# Patient Record
Sex: Male | Born: 1970 | Hispanic: No | Marital: Married | Smoking: Never smoker
Health system: Southern US, Community
[De-identification: ages and names within clinical notes are randomized; demographics above are authoritative.]

---

## 1997-07-01 HISTORY — PX: NOSE SURGERY: SHX723

## 2011-08-24 ENCOUNTER — Ambulatory Visit (INDEPENDENT_AMBULATORY_CARE_PROVIDER_SITE_OTHER): Payer: BC Managed Care – PPO | Admitting: Physician Assistant

## 2011-08-24 VITALS — BP 128/85 | HR 74 | Temp 98.3°F | Resp 18 | Ht 67.0 in | Wt 175.0 lb

## 2011-08-24 DIAGNOSIS — J302 Other seasonal allergic rhinitis: Secondary | ICD-10-CM

## 2011-08-24 DIAGNOSIS — J309 Allergic rhinitis, unspecified: Secondary | ICD-10-CM

## 2011-08-24 MED ORDER — FLUTICASONE PROPIONATE 50 MCG/ACT NA SUSP: 2.0000 | Freq: Every day | NASAL | Status: DC

## 2011-08-24 MED ORDER — CETIRIZINE HCL 10 MG PO TABS: 10.0000 mg | ORAL_TABLET | Freq: Every day | ORAL | Status: DC

## 2011-08-24 NOTE — Progress Notes (Signed)
  Subjective:    Patient ID: Ryan Mckinney, male    DOB: [birth year], 41 y.o.   MRN: [id number]  HPI Pt presents to clinic with 1-2 wks h/o congestion with clear/white rhinorrhea with sneezing and itchy ears and eyes - Some PND but no cold.  Does not feel sick.  Has h/o seasonal allergies every spring and this feels like that.  He has never used meds in the past but would like something now.  He has only used Advil without help.   Review of Systems  Constitutional: Negative for fever and chills.  HENT: Positive for congestion, rhinorrhea, sneezing and postnasal drip. Negative for ear pain, sore throat and sinus pressure.   Respiratory: Negative for cough.   Neurological: Negative for dizziness, light-headedness and headaches.       Objective:   Physical Exam  Nursing note and vitals reviewed. Constitutional: He is oriented to person, place, and time. He appears well-developed and well-nourished.  HENT:  Head: Normocephalic and atraumatic.  Right Ear: Hearing, tympanic membrane, external ear and ear canal normal.  Left Ear: Hearing, tympanic membrane, external ear and ear canal normal.  Nose: Mucosal edema (pale and boggy) present. No rhinorrhea.  Mouth/Throat: Uvula is midline and oropharynx is clear and moist. No oropharyngeal exudate.  Cardiovascular: Normal rate, regular rhythm and normal heart sounds.   No murmur heard. Pulmonary/Chest: Effort normal and breath sounds normal.  Neurological: He is alert and oriented to person, place, and time.  Skin: Skin is warm and dry.  Psychiatric: He has a normal mood and affect. His behavior is normal. Judgment and thought content normal.          Assessment & Plan:   1. Seasonal allergies  cetirizine (ZYRTEC) 10 MG tablet, fluticasone (FLONASE) 50 MCG/ACT nasal spray   Pt to try and see which ones helps the most.

## 2012-06-03 ENCOUNTER — Other Ambulatory Visit: Payer: Self-pay | Admitting: Physician Assistant

## 2014-08-26 ENCOUNTER — Telehealth: Payer: Self-pay | Admitting: Emergency Medicine

## 2014-08-26 ENCOUNTER — Ambulatory Visit (INDEPENDENT_AMBULATORY_CARE_PROVIDER_SITE_OTHER): Payer: BLUE CROSS/BLUE SHIELD

## 2014-08-26 ENCOUNTER — Ambulatory Visit (INDEPENDENT_AMBULATORY_CARE_PROVIDER_SITE_OTHER): Payer: BLUE CROSS/BLUE SHIELD | Admitting: Emergency Medicine

## 2014-08-26 VITALS — BP 153/97 | HR 107 | Temp 98.3°F | Resp 17 | Ht 67.0 in | Wt 182.4 lb

## 2014-08-26 DIAGNOSIS — M545 Low back pain, unspecified: Secondary | ICD-10-CM

## 2014-08-26 DIAGNOSIS — M549 Dorsalgia, unspecified: Secondary | ICD-10-CM | POA: Insufficient documentation

## 2014-08-26 DIAGNOSIS — M795 Residual foreign body in soft tissue: Secondary | ICD-10-CM

## 2014-08-26 DIAGNOSIS — R1032 Left lower quadrant pain: Secondary | ICD-10-CM | POA: Insufficient documentation

## 2014-08-26 LAB — POCT UA - MICROSCOPIC ONLY
Bacteria, U Microscopic: NEGATIVE
CASTS, UR, LPF, POC: NEGATIVE
CRYSTALS, UR, HPF, POC: NEGATIVE
MUCUS UA: NEGATIVE
RBC, URINE, MICROSCOPIC: NEGATIVE
WBC, UR, HPF, POC: NEGATIVE
Yeast, UA: NEGATIVE

## 2014-08-26 LAB — COMPREHENSIVE METABOLIC PANEL
ALT: 12 U/L (ref 0–53)
AST: 19 U/L (ref 0–37)
Albumin: 4.6 g/dL (ref 3.5–5.2)
Alkaline Phosphatase: 63 U/L (ref 39–117)
BUN: 14 mg/dL (ref 6–23)
CHLORIDE: 105 meq/L (ref 96–112)
CO2: 26 mEq/L (ref 19–32)
CREATININE: 1.05 mg/dL (ref 0.50–1.35)
Calcium: 9.1 mg/dL (ref 8.4–10.5)
GLUCOSE: 83 mg/dL (ref 70–99)
POTASSIUM: 4.2 meq/L (ref 3.5–5.3)
Sodium: 140 mEq/L (ref 135–145)
TOTAL PROTEIN: 7.7 g/dL (ref 6.0–8.3)
Total Bilirubin: 1 mg/dL (ref 0.2–1.2)

## 2014-08-26 LAB — POCT URINALYSIS DIPSTICK
Bilirubin, UA: NEGATIVE
Glucose, UA: NEGATIVE
Ketones, UA: NEGATIVE
Leukocytes, UA: NEGATIVE
NITRITE UA: NEGATIVE
PH UA: 6.5
Protein, UA: NEGATIVE
RBC UA: NEGATIVE
Spec Grav, UA: 1.02
UROBILINOGEN UA: 1

## 2014-08-26 LAB — POCT CBC
GRANULOCYTE PERCENT: 81.9 % — AB (ref 37–80)
HEMATOCRIT: 47.2 % (ref 43.5–53.7)
HEMOGLOBIN: 15.5 g/dL (ref 14.1–18.1)
Lymph, poc: 1.4 (ref 0.6–3.4)
MCH, POC: 27 pg (ref 27–31.2)
MCHC: 32.9 g/dL (ref 31.8–35.4)
MCV: 82.1 fL (ref 80–97)
MID (cbc): 0.3 (ref 0–0.9)
MPV: 6.7 fL (ref 0–99.8)
POC GRANULOCYTE: 7.4 — AB (ref 2–6.9)
POC LYMPH PERCENT: 15 %L (ref 10–50)
POC MID %: 3.1 %M (ref 0–12)
Platelet Count, POC: 256 10*3/uL (ref 142–424)
RBC: 5.75 M/uL (ref 4.69–6.13)
RDW, POC: 13.8 %
WBC: 9 10*3/uL (ref 4.6–10.2)

## 2014-08-26 LAB — HEMOCCULT GUIAC POC 1CARD (OFFICE): FECAL OCCULT BLD: POSITIVE

## 2014-08-26 MED ORDER — CYCLOBENZAPRINE HCL 10 MG PO TABS: ORAL_TABLET | ORAL | Status: DC

## 2014-08-26 MED ORDER — MELOXICAM 15 MG PO TABS: 15.0000 mg | ORAL_TABLET | Freq: Every day | ORAL | Status: DC

## 2014-08-26 NOTE — Telephone Encounter (Signed)
I called the home phone but there was no answer. I called the patient's place of work and left a message for him to either call me on my cell phone or stop by the office this weekend for repeat x-rays to see if we can better delineate where this foreign body is that was seen on his back films. We need to start by removing all his clothes and then take the films.

## 2014-08-26 NOTE — Progress Notes (Addendum)
Subjective:    Patient ID: Ryan Mckinney, male    DOB: Apr 02, 1971, 44 y.o.   MRN: [id number]  This chart was scribed for Ryan Russian, MD by Stephania Fragmin, ED Scribe. This patient was seen in room 10 and the patient's care was started at 1:59 PM.   HPI HPI Comments: Ryan Mckinney is a 44 y.o. male who presents to the [facility] complaining of chronic back pain that began 1 year ago, as well as bilateral leg pain. He was seen for this by Dr. Jimmye Norman, but he has not had any XRs done. 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 He last took medication for back pain 1 year ago. Patient denies any urinary symptoms.   Patient also complains of intermittent, chronic abdominal pain that has been worsening in the past year. He reports 1 daily BM that is normal. He states that he eats well, but he complains of constipation and pain with BMs. He denies a family history of abdominal problems.     Review of Systems  Constitutional: Negative for fatigue and unexpected weight change.  Eyes: Negative for visual disturbance.  Respiratory: Negative for cough, chest tightness and shortness of breath.   Cardiovascular: Negative for chest pain, palpitations and leg swelling.  Gastrointestinal: Positive for constipation and rectal pain.  Genitourinary: Negative for dysuria, urgency, frequency, hematuria, decreased urine volume and difficulty urinating.  Musculoskeletal: Positive for myalgias and back pain.  Neurological: Negative for dizziness, light-headedness and headaches.       Objective:   Physical Exam  Nursing note and vitals reviewed.  CONSTITUTIONAL: Well developed/well nourished HEAD: Normocephalic/atraumatic EYES: EOMI/PERRL ENMT: Mucous membranes moist NECK: supple no meningeal signs SPINE/BACK: Minimal tenderness over the lumbar spine.  CV: S1/S2 noted, no murmurs/rubs/gallops  noted LUNGS: Lungs are clear to auscultation bilaterally, no apparent distress ABDOMEN: soft, mild tenderness in the LLQ, no rebound, no masses felt GU:no cva tenderness NEURO: Pt is awake/alert/appropriate, moves all extremitiesx4.  No facial droop.   EXTREMITIES: His reflexes are 2+. His motor strength is 5/5. His straight leg is normal to 90 degrees in both legs.  SKIN: warm, color normal PSYCH: no abnormalities of mood noted, alert and oriented to situation  Results for orders placed or performed in visit on 08/26/14  POCT CBC  Result Value Ref Range   WBC 9.0 4.6 - 10.2 K/uL   Lymph, poc 1.4 0.6 - 3.4   POC LYMPH PERCENT 15.0 10 - 50 %L   MID (cbc) 0.3 0 - 0.9   POC MID % 3.1 0 - 12 %M   POC Granulocyte 7.4 (A) 2 - 6.9   Granulocyte percent 81.9 (A) 37 - 80 %G   RBC 5.75 4.69 - 6.13 M/uL   Hemoglobin 15.5 14.1 - 18.1 g/dL   HCT, POC 47.2 43.5 - 53.7 %   MCV 82.1 80 - 97 fL   MCH, POC 27.0 27 - 31.2 pg   MCHC 32.9 31.8 - 35.4 g/dL   RDW, POC 13.8 %   Platelet Count, POC 256 142 - 424 K/uL   MPV 6.7 0 - 99.8 fL  POCT urinalysis dipstick  Result Value Ref Range   Color, UA yellow    Clarity, UA clear    Glucose, UA neg    Bilirubin, UA neg    Ketones, UA neg    Spec Grav, UA 1.020  Blood, UA neg    pH, UA 6.5    Protein, UA neg    Urobilinogen, UA 1.0    Nitrite, UA neg    Leukocytes, UA Negative   POCT UA - Microscopic Only  Result Value Ref Range   WBC, Ur, HPF, POC neg    RBC, urine, microscopic neg    Bacteria, U Microscopic neg    Mucus, UA neg    Epithelial cells, urine per micros 0-2    Crystals, Ur, HPF, POC neg    Casts, Ur, LPF, POC neg    Yeast, UA neg    [facility] reading (PRIMARY) by  Dr. Everlene Farrier back films are normal there are some calcifications deep in the pelvis on the left. No definite kidney stones. Hemoccult POSITIVE      Assessment & Plan:  Referral made to GI for his one-year history of left lower quadrant abdominal pain. He does have blood  in his stool. The pain is been going on for one year. Referral made to GI. Will treat his back pain with Mobic and Flexeril. He was given a note for work today.I personally performed the services described in this documentation, which was scribed in my presence. The recorded information has been reviewed and is accurate. The radiologist saw a linear foreign body present on the abdominal film. It is unclear about what this is. This will need further evaluation. The patient's home phone did not have an answering machine. I called the patient's work and spoke with the operator there and she said she would try and contact one of his friends. They were going to go by's house and tell him to come by this weekend. I would suggest repeat films to start with and see if we can get a better idea about where this foreign body is or if it is in his clothing. Once this is done we can decide on further evaluation.

## 2014-08-26 NOTE — Telephone Encounter (Signed)
I spoke with patient's son. They will come at 10 in the morning to further evaluate the FB seen on film.

## 2014-08-26 NOTE — Patient Instructions (Signed)
Abdominal Pain Many things can cause abdominal pain. Usually, abdominal pain is not caused by a disease and will improve without treatment. It can often be observed and treated at home. Your health care provider will do a physical exam and possibly order blood tests and X-rays to help determine the seriousness of your pain. However, in many cases, more time must pass before a clear cause of the pain can be found. Before that point, your health care provider may not know if you need more testing or further treatment. HOME CARE INSTRUCTIONS  Monitor your abdominal pain for any changes. The following actions may help to alleviate any discomfort you are experiencing:  Only take over-the-counter or prescription medicines as directed by your health care provider.  Do not take laxatives unless directed to do so by your health care provider.  Try a clear liquid diet (broth, tea, or water) as directed by your health care provider. Slowly move to a bland diet as tolerated. SEEK MEDICAL CARE IF:  You have unexplained abdominal pain.  You have abdominal pain associated with nausea or diarrhea.  You have pain when you urinate or have a bowel movement.  You experience abdominal pain that wakes you in the night.  You have abdominal pain that is worsened or improved by eating food.  You have abdominal pain that is worsened with eating fatty foods.  You have a fever. SEEK IMMEDIATE MEDICAL CARE IF:   Your pain does not go away within 2 hours.  You keep throwing up (vomiting).  Your pain is felt only in portions of the abdomen, such as the right side or the left lower portion of the abdomen.  You pass bloody or black tarry stools. MAKE SURE YOU:  Understand these instructions.   Will watch your condition.   Will get help right away if you are not doing well or get worse.  Document Released: 03/27/2005 Document Revised: 06/22/2013 Document Reviewed: 02/24/2013 [facility] Patient Information  2015 [place], [place]. This information is not intended to replace advice given to you by your health care provider. Make sure you discuss any questions you have with your health care provider.    Lumbosacral Strain Lumbosacral strain is a strain of any of the parts that make up your lumbosacral vertebrae. Your lumbosacral vertebrae are the bones that make up the lower third of your backbone. Your lumbosacral vertebrae are held together by muscles and tough, fibrous tissue (ligaments).  CAUSES  A sudden blow to your back can cause lumbosacral strain. Also, anything that causes an excessive stretch of the muscles in the low back can cause this strain. This is typically seen when people exert themselves strenuously, fall, lift heavy objects, bend, or crouch repeatedly. RISK FACTORS  Physically demanding work.  Participation in pushing or pulling sports or sports that require a sudden twist of the back (tennis, golf, baseball).  Weight lifting.  Excessive lower back curvature.  Forward-tilted pelvis.  Weak back or abdominal muscles or both.  Tight hamstrings. SIGNS AND SYMPTOMS  Lumbosacral strain may cause pain in the area of your injury or pain that moves (radiates) down your leg.  DIAGNOSIS Your health care provider can often diagnose lumbosacral strain through a physical exam. In some cases, you may need tests such as X-ray exams.  TREATMENT  Treatment for your lower back injury depends on many factors that your clinician will have to evaluate. However, most treatment will include the use of anti-inflammatory medicines. HOME CARE INSTRUCTIONS   Avoid  hard physical activities (tennis, racquetball, waterskiing) if you are not in proper physical condition for it. This may aggravate or create problems.  If you have a back problem, avoid sports requiring sudden body movements. Swimming and walking are generally safer activities.  Maintain good posture.  Maintain a healthy  weight.  For acute conditions, you may put ice on the injured area.  Put ice in a plastic bag.  Place a towel between your skin and the bag.  Leave the ice on for 20 minutes, 2-3 times a day.  When the low back starts healing, stretching and strengthening exercises may be recommended. SEEK MEDICAL CARE IF:  Your back pain is getting worse.  You experience severe back pain not relieved with medicines. SEEK IMMEDIATE MEDICAL CARE IF:   You have numbness, tingling, weakness, or problems with the use of your arms or legs.  There is a change in bowel or bladder control.  You have increasing pain in any area of the body, including your belly (abdomen).  You notice shortness of breath, dizziness, or feel faint.  You feel sick to your stomach (nauseous), are throwing up (vomiting), or become sweaty.  You notice discoloration of your toes or legs, or your feet get very cold. MAKE SURE YOU:   Understand these instructions.  Will watch your condition.  Will get help right away if you are not doing well or get worse. Document Released: 03/27/2005 Document Revised: 06/22/2013 Document Reviewed: 02/03/2013 [facility] Patient Information 2015 [place], [place]. This information is not intended to replace advice given to you by your health care provider. Make sure you discuss any questions you have with your health care provider.

## 2014-08-27 ENCOUNTER — Ambulatory Visit (HOSPITAL_COMMUNITY)
Admission: RE | Admit: 2014-08-27 | Discharge: 2014-08-27 | Disposition: A | Discharge status: Home / Self Care | Payer: BLUE CROSS/BLUE SHIELD | Source: Ambulatory Visit | Attending: Emergency Medicine | Admitting: Emergency Medicine

## 2014-08-27 ENCOUNTER — Encounter (HOSPITAL_COMMUNITY): Payer: Self-pay

## 2014-08-27 ENCOUNTER — Other Ambulatory Visit: Payer: Self-pay | Admitting: Emergency Medicine

## 2014-08-27 ENCOUNTER — Ambulatory Visit (INDEPENDENT_AMBULATORY_CARE_PROVIDER_SITE_OTHER): Payer: BLUE CROSS/BLUE SHIELD

## 2014-08-27 ENCOUNTER — Ambulatory Visit (INDEPENDENT_AMBULATORY_CARE_PROVIDER_SITE_OTHER): Payer: BLUE CROSS/BLUE SHIELD | Admitting: Emergency Medicine

## 2014-08-27 ENCOUNTER — Encounter: Payer: Self-pay | Admitting: Emergency Medicine

## 2014-08-27 VITALS — BP 130/74 | HR 91 | Temp 98.1°F | Resp 12 | Ht 67.0 in | Wt 177.0 lb

## 2014-08-27 DIAGNOSIS — R935 Abnormal findings on diagnostic imaging of other abdominal regions, including retroperitoneum: Secondary | ICD-10-CM

## 2014-08-27 DIAGNOSIS — R937 Abnormal findings on diagnostic imaging of other parts of musculoskeletal system: Secondary | ICD-10-CM

## 2014-08-27 DIAGNOSIS — R938 Abnormal findings on diagnostic imaging of other specified body structures: Secondary | ICD-10-CM | POA: Insufficient documentation

## 2014-08-27 DIAGNOSIS — M795 Residual foreign body in soft tissue: Secondary | ICD-10-CM | POA: Diagnosis not present

## 2014-08-27 NOTE — Progress Notes (Addendum)
   Subjective:    Patient ID: Ryan Mckinney, male    DOB: Sep 14, 1970, 44 y.o.   MRN: [id number]  HPI This chart was scribed for Ryan Queen, MD by Thea Alken, ED Scribe. This patient was seen in room 9 and the patient's care was started at 10:07 AM.  HPI Comments: Ryan Mckinney is a 44 y.o. male who presents to the [facility] for a recheck. Pt was seen here yesterday with low back and LLQ abdominal pain. Xrays that were done showed a needle like metallic foreign body on the right side of pelvis. Pt was advised to be seen today to further discuss management. Pt denies swallowing anything needle like.  Pt does not sow. Pt denies past surgeries. Pt is from [place] and came to the [place] in 2000.   No past medical history on file. No past surgical history on file. Prior to Admission medications   Medication Sig Start Date End Date Taking? Authorizing Provider  cetirizine (ZYRTEC) 10 MG tablet Take 1 tablet (10 mg total) by mouth daily. 08/24/11 08/23/12  Mancel Bale, PA-C  cyclobenzaprine (FLEXERIL) 10 MG tablet Take 1 tablet at bedtime. 08/26/14   [name], MD  fluticasone (FLONASE) 50 MCG/ACT nasal spray PLACE 2 SPRAYS INTO THE NOSE DAILY. Patient not taking: Reported on 08/26/2014 06/03/12   Rise Mu, PA-C  meloxicam (MOBIC) 15 MG tablet Take 1 tablet (15 mg total) by mouth daily. 08/26/14   [name], MD   Review of Systems  Gastrointestinal: Positive for abdominal pain.    Objective:   Physical Exam  CONSTITUTIONAL: Well developed/well nourished HEAD: Normocephalic/atraumatic EYES: EOMI/PERRL ENMT: Mucous membranes moist NECK: supple no meningeal signs SPINE/BACK:entire spine nontender CV: S1/S2 noted, no murmurs/rubs/gallops noted LUNGS: Lungs are clear to auscultation bilaterally, no apparent distress ABDOMEN: soft, mild LLQ tender, no rebound or guarding, bowel sounds noted throughout abdomen GU:no cva tenderness NEURO: Pt is awake/alert/appropriate, moves  all extremitiesx4.  No facial droop.   EXTREMITIES: pulses normal/equal, full ROM SKIN: warm, color normal PSYCH: no abnormalities of mood noted, alert and oriented to situation [facility] reading (PRIMARY) by  Dr. Everlene Farrier warned body is visible on the AP view. I do not see it on the lateral view.   Assessment & Plan:  We'll proceed with CT of the pelvis to evaluate this area. Patient will go to [facility] his CT I do have his phone numbers down to contact him I have asked him not to leave until I can let him know what the CT shows and we have a plan of action.I personally performed the services described in this documentation, which was scribed in my presence. The recorded information has been reviewed and is accurate. CT shows the foreign body is present adjacent to the iliac crest. There is no associated soft tissue swelling or abscess formation around the foreign body. No treatment necessary.I personally performed the services described in this documentation, which was scribed in my presence. The recorded information has been reviewed and is accurate.

## 2014-08-27 NOTE — Progress Notes (Deleted)
   Subjective:    Patient ID: Ryan Mckinney, male    DOB: [birth year], 44 y.o.   MRN: [id number]  HPI    Review of Systems     Objective:   Physical Exam        Assessment & Plan:

## 2014-08-27 NOTE — Addendum Note (Signed)
Addended by: Burnis Kingfisher on: 08/27/2014 11:35 AM   Modules accepted: Orders

## 2014-09-02 ENCOUNTER — Encounter: Payer: Self-pay | Admitting: Physician Assistant

## 2014-09-02 ENCOUNTER — Ambulatory Visit (INDEPENDENT_AMBULATORY_CARE_PROVIDER_SITE_OTHER): Payer: BLUE CROSS/BLUE SHIELD | Admitting: Physician Assistant

## 2014-09-02 VITALS — BP 128/84 | HR 88 | Ht 66.5 in | Wt 180.4 lb

## 2014-09-02 DIAGNOSIS — R1013 Epigastric pain: Secondary | ICD-10-CM

## 2014-09-02 DIAGNOSIS — K5900 Constipation, unspecified: Secondary | ICD-10-CM

## 2014-09-02 DIAGNOSIS — R1032 Left lower quadrant pain: Secondary | ICD-10-CM

## 2014-09-02 DIAGNOSIS — K921 Melena: Secondary | ICD-10-CM

## 2014-09-02 MED ORDER — MOVIPREP 100 G PO SOLR: 1.0000 | ORAL | Status: DC

## 2014-09-02 NOTE — Patient Instructions (Signed)
You have been scheduled for an endoscopy and colonoscopy. Please follow the written instructions given to you at your visit today. Please pick up your prep supplies at the pharmacy within the next 1-3 days. [facility], [address].  If you use inhalers (even only as needed), please bring them with you on the day of your procedure. Your physician has requested that you go to www.startemmi.com and enter the access code given to you at your visit today. This web site gives a general overview about your procedure. However, you should still follow specific instructions given to you by our office regarding your preparation for the procedure.  Take Benefiber in a glass of water daily to help with constipation.

## 2014-09-02 NOTE — Progress Notes (Signed)
Patient ID: Ryan Mckinney, male   DOB: [birth year], 44 y.o.   MRN: [id number]   Subjective:    Patient ID: Ryan Mckinney, male    DOB: 07/10/1970, 44 y.o.   MRN: [id number]  HPI Ryan Mckinney is a 44 year old [place] male, new to GI referred by Dr. Everlene Farrier for evaluation of left lower quadrant pain, constipation and Hemoccult-positive stool. Patient speaks English and is by himself today. He has no known chronic medical problems, states he has been in the [place] for about 15 years. No prior GI evaluation and His English is a bit limited but he states that he has had pain on his left side off and on over the past 2-3 years, this seems to be worse with lifting which he has to do frequently for his work. He also says he has left lower quadrant discomfort with bowel movements. He has been seeing some bright red blood intermittently with bowel movements over the past couple of years. This is worse if he eats hot peppers and generally doesn't see blood if no hot peppers consumed. His appetite is been good and his weight has been stable. He also complains of epigastric pain postprandially and heartburn and no dysphagia. Pt States he has one small BM daily, some straining. Patient had labs done 09/02/2014 WBC of 9 hemoglobin 15.5 hematocrit of 47.2 MCV of 82 stool was Hemoccult positive UA negative and see met entirely normal. He had apparently also been complaining of some lower back pain. He had plain films done that showed a probable foreign body in the right pelvis. Subsequent CT of the pelvis showed a probable 3.1 cm needlelike objects adjacent to the right posterior iliac wing just adjacent to the bone.  Family hx negative for colon cancer.  Review of Systems Pertinent positive and negative review of systems were noted in the above HPI section.  All other review of systems was otherwise negative.  Outpatient Encounter Prescriptions as of 09/02/2014  Medication Sig  . cyclobenzaprine (FLEXERIL) 10 MG tablet Take 1 tablet at  bedtime.  . meloxicam (MOBIC) 15 MG tablet Take 1 tablet (15 mg total) by mouth daily.  Marland Kitchen MOVIPREP 100 G SOLR Take 1 kit (200 g total) by mouth as directed.  . [DISCONTINUED] cetirizine (ZYRTEC) 10 MG tablet Take 1 tablet (10 mg total) by mouth daily. (Patient not taking: Reported on 09/02/2014)   No Known Allergies Patient Active Problem List   Diagnosis Date Noted  . Back pain 08/26/2014  . Abdominal pain, left lower quadrant 08/26/2014  . Foreign body (FB) in soft tissue 08/26/2014   History   Social History  . Marital Status: Single    Spouse Name: N/A  . Number of Children: 2  . Years of Education: N/A   Occupational History  . Not on file.   Social History Main Topics  . Smoking status: Never Smoker   . Smokeless tobacco: Never Used  . Alcohol Use: No  . Drug Use: No  . Sexual Activity: Not on file   Other Topics Concern  . Not on file   Social History Narrative    [name] family history is not on file.      Objective:    Filed Vitals:   09/02/14 1033  BP: 128/84  Pulse: 88    Physical Exam  well-developed [place] male in no acute distress. Blood pressure 128/84 pulse 88 height 5 foot 6 weight 180. HEENT; nontraumatic normocephalic EOMI PERRLA sclera anicteric, Supple ;no JVD, Cardiovascular; regular  rate and rhythm with S1-S2 no murmur or gallop, Pulmonary; clear bilaterally, Abdomen; soft very minimally tender in the left lower quadrant no palpable mass or hepatosplenomegaly no guarding or rebound bowel sounds are present, Rectal; exam not done he was recently documented Hemoccult positive, Ext; no clubbing cyanosis or edema skin warm and dry, Psych; mood and affect appropriate       Assessment & Plan:   #1  44 yo [place]  male with LLQ pain, and intermittent BRB per rectum x 2-3 yeas. Etiology not clear R/O occult lesion, mild colitis or proctitis. #2 heme + stool #3 Constipation #4 Epigastric pain-r/o PUD, Hpylori  Plan; Pt is advised to  increase water intake, Add benefiber daily Schedule for colonoscopy and EGD with Dr. Ardis Hughs. Procedures discussed in detail and he is agreeable to proceed  Further plans pending above  CC: Dr Everlene Farrier  Alfredia Ferguson PA-C 09/02/2014   Cc: [name], MD

## 2014-09-02 NOTE — Progress Notes (Signed)
i agree with the above note, plan 

## 2014-09-15 ENCOUNTER — Encounter: Payer: Self-pay | Admitting: Gastroenterology

## 2014-10-06 ENCOUNTER — Telehealth: Payer: Self-pay | Admitting: Gastroenterology

## 2014-10-06 DIAGNOSIS — K921 Melena: Secondary | ICD-10-CM

## 2014-10-06 DIAGNOSIS — R1013 Epigastric pain: Secondary | ICD-10-CM

## 2014-10-06 DIAGNOSIS — K5900 Constipation, unspecified: Secondary | ICD-10-CM

## 2014-10-06 MED ORDER — MOVIPREP 100 G PO SOLR: ORAL | Status: DC

## 2014-10-06 NOTE — Telephone Encounter (Signed)
Spoke with Jenny Reichmann, at pt's work.  Pt drank his entire prep after his OV.  Resent Moviprep to pt's pharmacy.  He didn't have his instructions.  Spoke with Jenny Reichmann, who translated for pt-  He did eat today- chips and pancakes.  I told him to only drink clear liquids until 11:00 tomorrow and to push fluids.  I reviewed the prep times with Jenny Reichmann and understanding voiced.

## 2014-10-07 ENCOUNTER — Ambulatory Visit (AMBULATORY_SURGERY_CENTER): Payer: BLUE CROSS/BLUE SHIELD | Admitting: Gastroenterology

## 2014-10-07 ENCOUNTER — Encounter: Payer: Self-pay | Admitting: Gastroenterology

## 2014-10-07 VITALS — BP 126/80 | HR 62 | Temp 96.9°F | Resp 17 | Ht 66.5 in | Wt 180.0 lb

## 2014-10-07 DIAGNOSIS — D122 Benign neoplasm of ascending colon: Secondary | ICD-10-CM

## 2014-10-07 DIAGNOSIS — D123 Benign neoplasm of transverse colon: Secondary | ICD-10-CM | POA: Diagnosis not present

## 2014-10-07 DIAGNOSIS — R1032 Left lower quadrant pain: Secondary | ICD-10-CM

## 2014-10-07 DIAGNOSIS — K2990 Gastroduodenitis, unspecified, without bleeding: Secondary | ICD-10-CM | POA: Diagnosis not present

## 2014-10-07 DIAGNOSIS — K2970 Gastritis, unspecified, without bleeding: Secondary | ICD-10-CM

## 2014-10-07 MED ORDER — SODIUM CHLORIDE 0.9 % IV SOLN: 500.0000 mL | INTRAVENOUS | Status: DC

## 2014-10-07 NOTE — Op Note (Signed)
[facility]  [address] [place], [id number]   COLONOSCOPY PROCEDURE REPORT  PATIENT: Ryan Mckinney, Ryan Mckinney  MR#: [id number] BIRTHDATE: 1971/03/28 , 44  yrs. old GENDER: male ENDOSCOPIST: Milus Banister, MD REFERRED HB:ZJIRCV Everlene Farrier, M.D. PROCEDURE DATE:  10/07/2014 PROCEDURE:   Colonoscopy with snare polypectomy First Screening Colonoscopy - Avg.  risk and is 50 yrs.  old or older - No.  Prior Negative Screening - Now for repeat screening. N/A  History of Adenoma - Now for follow-up colonoscopy & has been > or = to 3 yrs.  N/A ASA CLASS:   Class II INDICATIONS:abdominal pain in the lower left quadrant. MEDICATIONS: Monitored anesthesia care and Propofol 200 mg IV  DESCRIPTION OF PROCEDURE:   After the risks benefits and alternatives of the procedure were thoroughly explained, informed consent was obtained.  The digital rectal exam revealed no abnormalities of the rectum.   The LB [id number]  endoscope was introduced through the anus and advanced to the cecum, which was identified by both the appendix and ileocecal valve. No adverse events experienced.   The quality of the prep was excellent.  The instrument was then slowly withdrawn as the colon was fully examined.   COLON FINDINGS: Three sessile polyps ranging between 3-70mm in size were found in the transverse colon and ascending colon. Polypectomies were performed with a cold snare.  The resection was complete, the polyp tissue was completely retrieved and sent to histology.   The examination was otherwise normal.  Retroflexed views revealed no abnormalities. The time to cecum = 1.2 Withdrawal time = 14.6   The scope was withdrawn and the procedure completed. COMPLICATIONS: There were no immediate complications.  ENDOSCOPIC IMPRESSION: 1. Three sessile polyps ranging between 3-48mm in size were found in the transverse colon and ascending colon; polypectomies were performed with a cold snare 2.   The  examination was otherwise normal  RECOMMENDATIONS: If the polyp(s) removed today are proven to be adenomatous (pre-cancerous) polyps, you will need a colonoscopy in 3-5 years. Otherwise you should continue to follow colorectal cancer screening guidelines for "routine risk" patients with a colonoscopy in 10 years.  You will receive a letter within 1-2 weeks with the results of your biopsy as well as final recommendations.  Please call my office if you have not received a letter after 3 weeks.  eSigned:  Milus Banister, MD 10/07/2014 2:13 PM

## 2014-10-07 NOTE — Progress Notes (Signed)
Called to room to assist during endoscopic procedure.  Patient ID and intended procedure confirmed with present staff. Received instructions for my participation in the procedure from the performing physician.  

## 2014-10-07 NOTE — Op Note (Signed)
[facility]  [address] [place], [address]   ENDOSCOPY PROCEDURE REPORT  PATIENT: Ryan, Mckinney  MR#: [id number] BIRTHDATE: [birth year] , 44  yrs. old GENDER: male ENDOSCOPIST: Milus Banister, MD PROCEDURE DATE:  10/07/2014 PROCEDURE:  EGD w/ biopsy ASA CLASS:     Class II INDICATIONS:  LLQ pain, epigastric pain. MEDICATIONS: Monitored anesthesia care and Propofol 70 mg IV TOPICAL ANESTHETIC: none  DESCRIPTION OF PROCEDURE: After the risks benefits and alternatives of the procedure were thoroughly explained, informed consent was obtained.  The LB [id number] endoscope was introduced through the mouth and advanced to the second portion of the duodenum , Without limitations.  The instrument was slowly withdrawn as the mucosa was fully examined.  There was mild, non-specific distal gastritis.  This was biopsies and sent to pathology.  The examination was otherwise normal. Retroflexed views revealed no abnormalities.     The scope was then withdrawn from the patient and the procedure completed.  COMPLICATIONS: There were no immediate complications.  ENDOSCOPIC IMPRESSION: There was mild, non-specific distal gastritis.  This was biopsies and sent to pathology.  The examination was otherwise normal  RECOMMENDATIONS: Await biopsy results   eSigned:  Milus Banister, MD 10/07/2014 2:24 PM    CC: Dr. Everlene Farrier

## 2014-10-07 NOTE — Patient Instructions (Signed)
Impressions/recommendations:  Polyps (handout given)  Repeat colonoscopy pending pathology results.  Mild gastritis.  Await pathology results.  YOU HAD AN ENDOSCOPIC PROCEDURE TODAY AT [facility]:   Refer to the procedure report that was given to you for any specific questions about what was found during the examination.  If the procedure report does not answer your questions, please call your gastroenterologist to clarify.  If you requested that your care partner not be given the details of your procedure findings, then the procedure report has been included in a sealed envelope for you to review at your convenience later.  YOU SHOULD EXPECT: Some feelings of bloating in the abdomen. Passage of more gas than usual.  Walking can help get rid of the air that was put into your GI tract during the procedure and reduce the bloating. If you had a lower endoscopy (such as a colonoscopy or flexible sigmoidoscopy) you may notice spotting of blood in your stool or on the toilet paper. If you underwent a bowel prep for your procedure, you may not have a normal bowel movement for a few days.  Please Note:  You might notice some irritation and congestion in your nose or some drainage.  This is from the oxygen used during your procedure.  There is no need for concern and it should clear up in a day or so.  SYMPTOMS TO REPORT IMMEDIATELY:   Following lower endoscopy (colonoscopy or flexible sigmoidoscopy):  Excessive amounts of blood in the stool  Significant tenderness or worsening of abdominal pains  Swelling of the abdomen that is new, acute  Fever of 100F or higher   Following upper endoscopy (EGD)  Vomiting of blood or coffee ground material  New chest pain or pain under the shoulder blades  Painful or persistently difficult swallowing  New shortness of breath  Fever of 100F or higher  Black, tarry-looking stools  For urgent or emergent issues, a gastroenterologist can be  reached at any hour by calling [phone].   DIET: Your first meal following the procedure should be a small meal and then it is ok to progress to your normal diet. Heavy or fried foods are harder to digest and may make you feel nauseous or bloated.  Likewise, meals heavy in dairy and vegetables can increase bloating.  Drink plenty of fluids but you should avoid alcoholic beverages for 24 hours.  ACTIVITY:  You should plan to take it easy for the rest of today and you should NOT DRIVE or use heavy machinery until tomorrow (because of the sedation medicines used during the test).    FOLLOW UP: Our staff will call the number listed on your records the next business day following your procedure to check on you and address any questions or concerns that you may have regarding the information given to you following your procedure. If we do not reach you, we will leave a message.  However, if you are feeling well and you are not experiencing any problems, there is no need to return our call.  We will assume that you have returned to your regular daily activities without incident.  If any biopsies were taken you will be contacted by phone or by letter within the next 1-3 weeks.  Please call us at [phone] if you have not heard about the biopsies in 3 weeks.    SIGNATURES/CONFIDENTIALITY: You and/or your care partner have signed paperwork which will be entered into your electronic medical record.  These signatures attest to the fact that that the information above on your After Visit Summary has been reviewed and is understood.  Full responsibility of the confidentiality of this discharge information lies with you and/or your care-partner.

## 2014-10-07 NOTE — Progress Notes (Signed)
A/ox3 pleased with MAC, report to Tracy RN 

## 2014-10-10 ENCOUNTER — Telehealth: Payer: Self-pay | Admitting: *Deleted

## 2014-10-10 NOTE — Telephone Encounter (Signed)
No answer, left message to call if questions or concerns. 

## 2014-10-12 ENCOUNTER — Encounter: Payer: Self-pay | Admitting: Gastroenterology

## 2015-04-04 ENCOUNTER — Encounter: Payer: Self-pay | Admitting: Emergency Medicine

## 2015-07-30 IMAGING — CR DG PELVIS 1-2V
4 series · 4 of 4 positions shown · non-contrast
Comparison: Lumbar spine series 08/26/2014

CLINICAL DATA: Abnormal pelvic x-ray. Foreign body noted on prior
study.

EXAM:
PELVIS - 1-2 VIEW

[AP (1 of 4)]
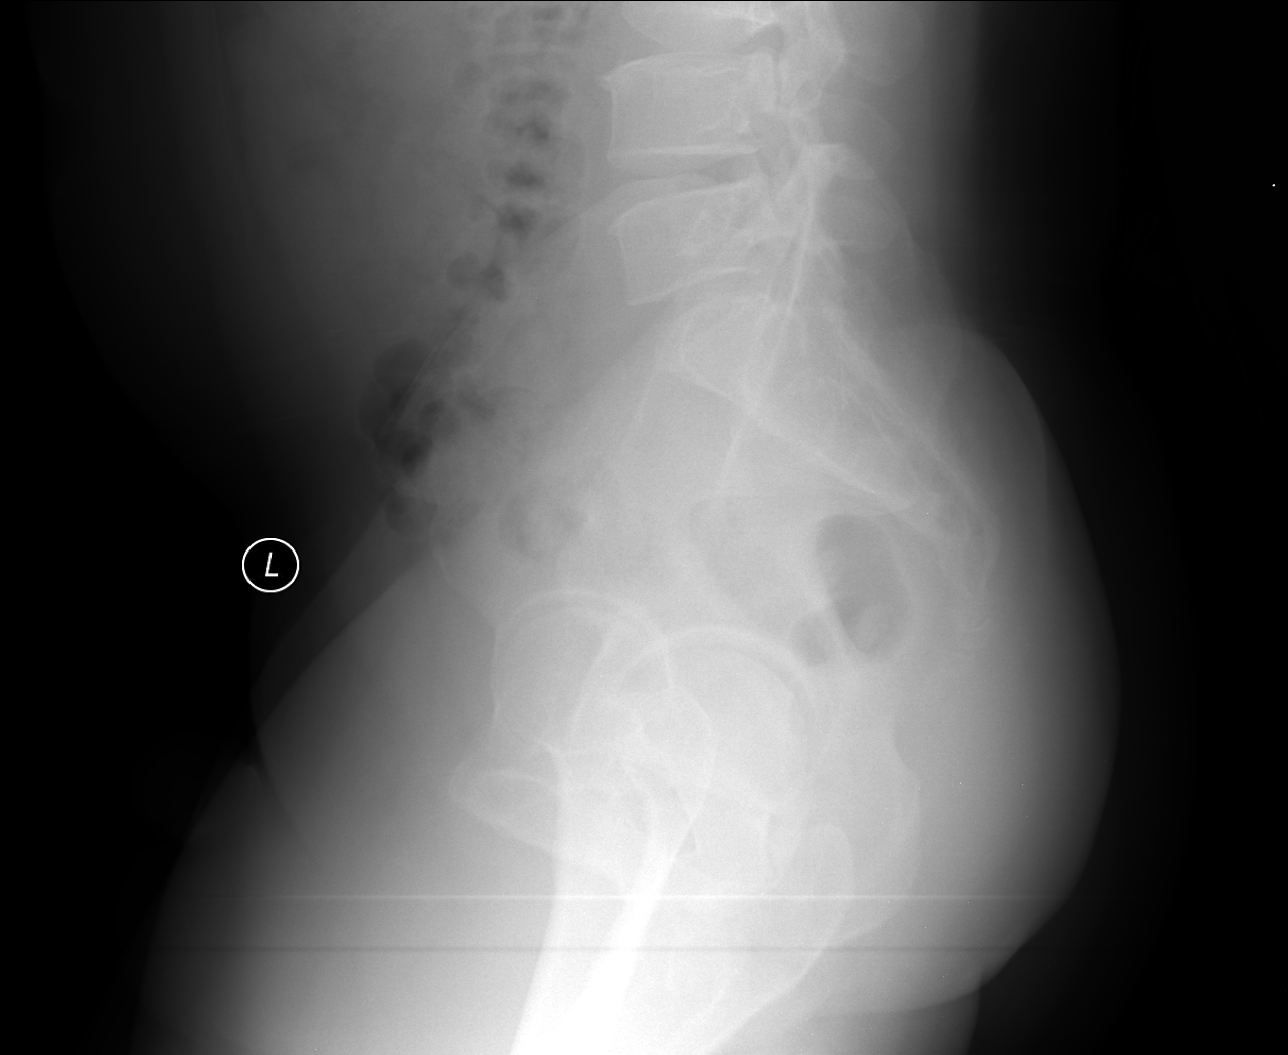

[AP (2 of 4)]
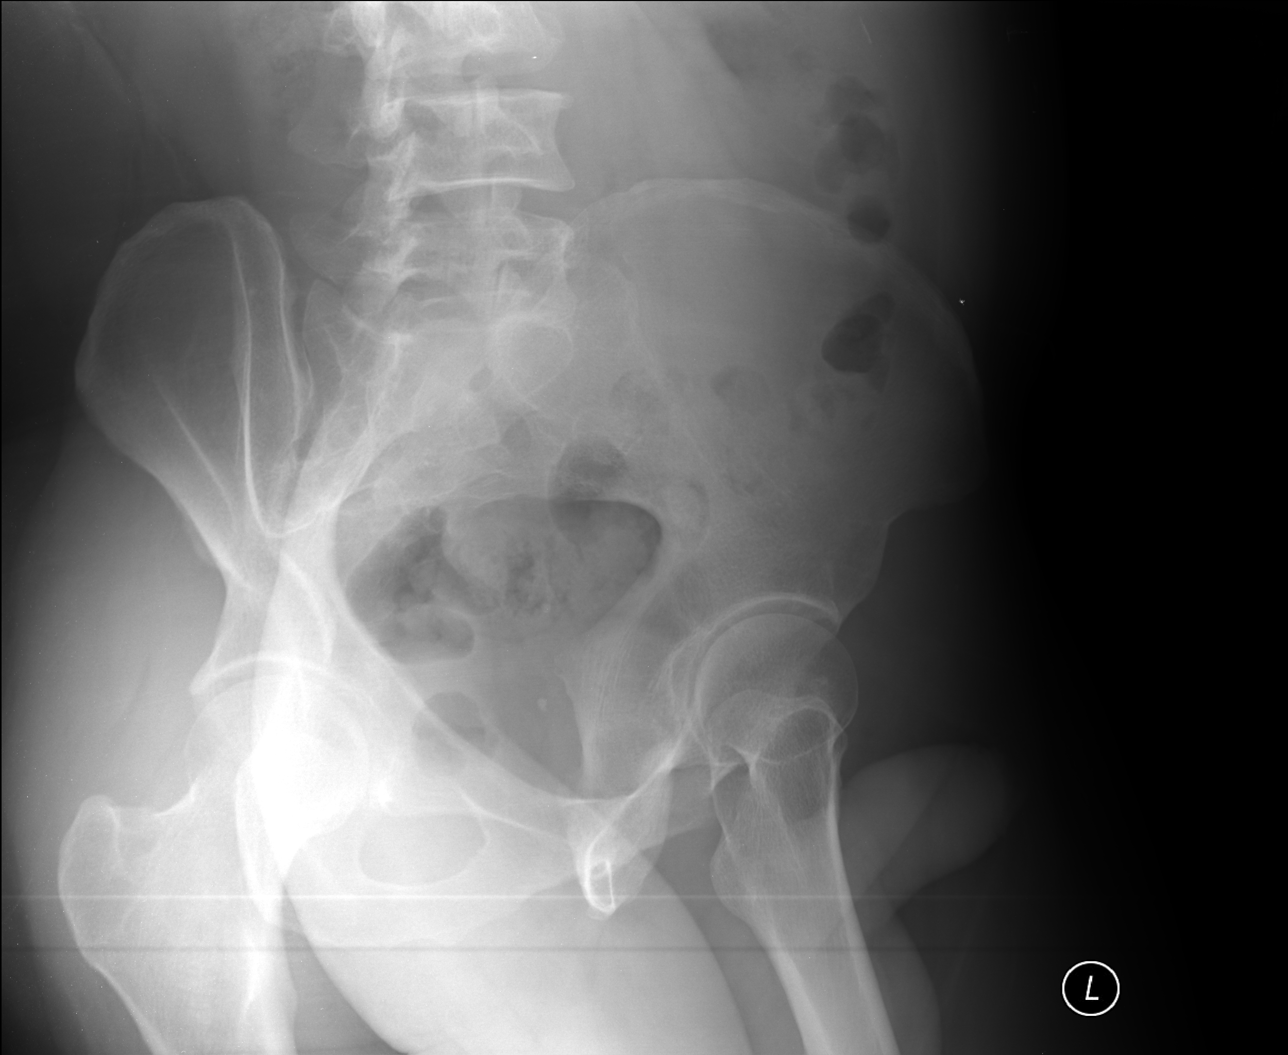

[AP (3 of 4)]
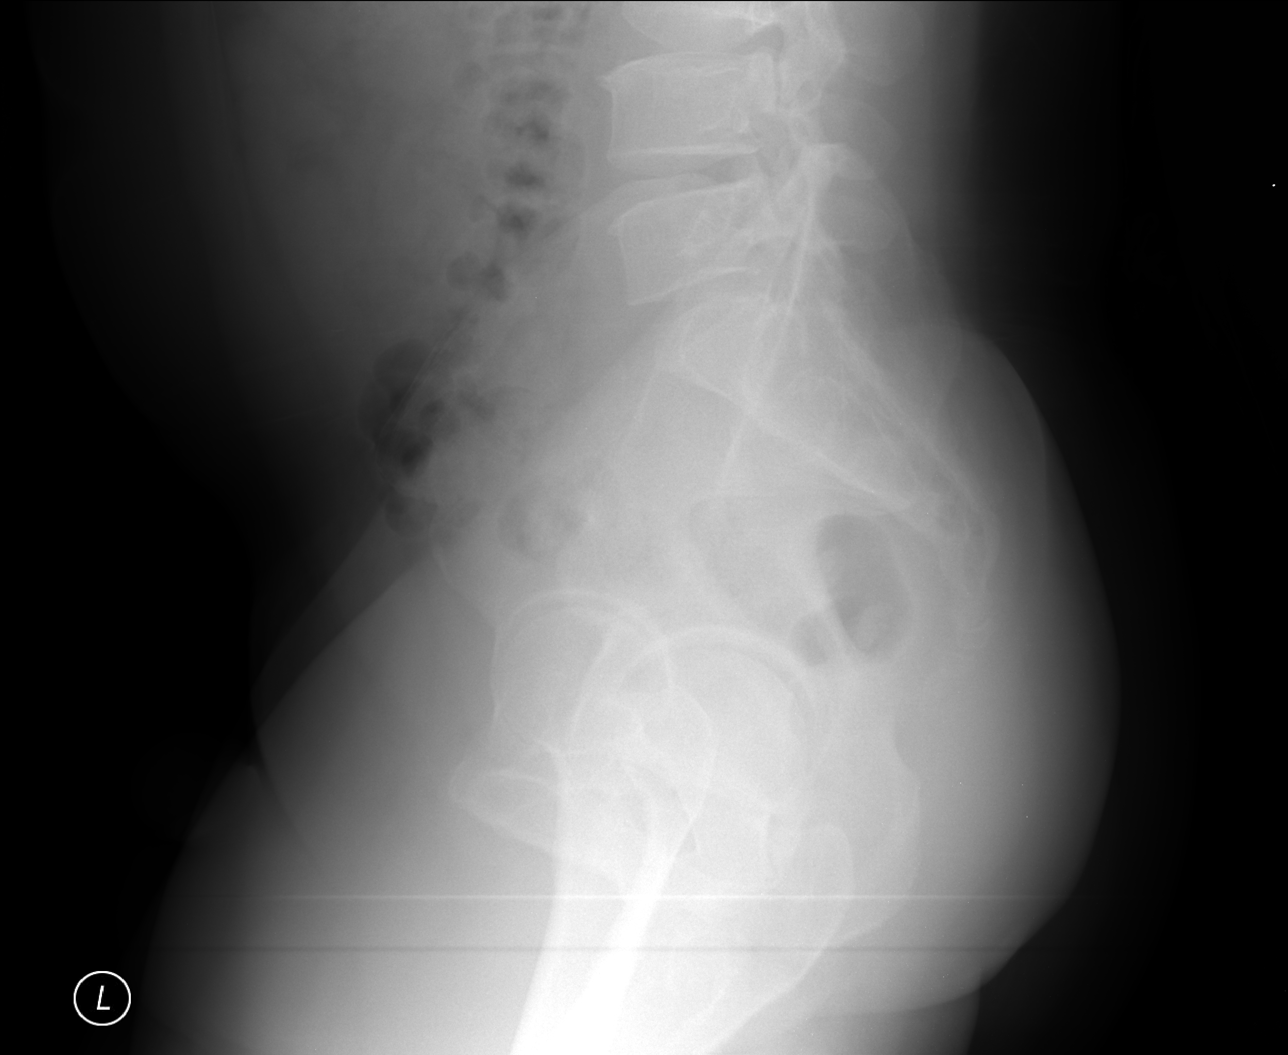

[AP (4 of 4)]
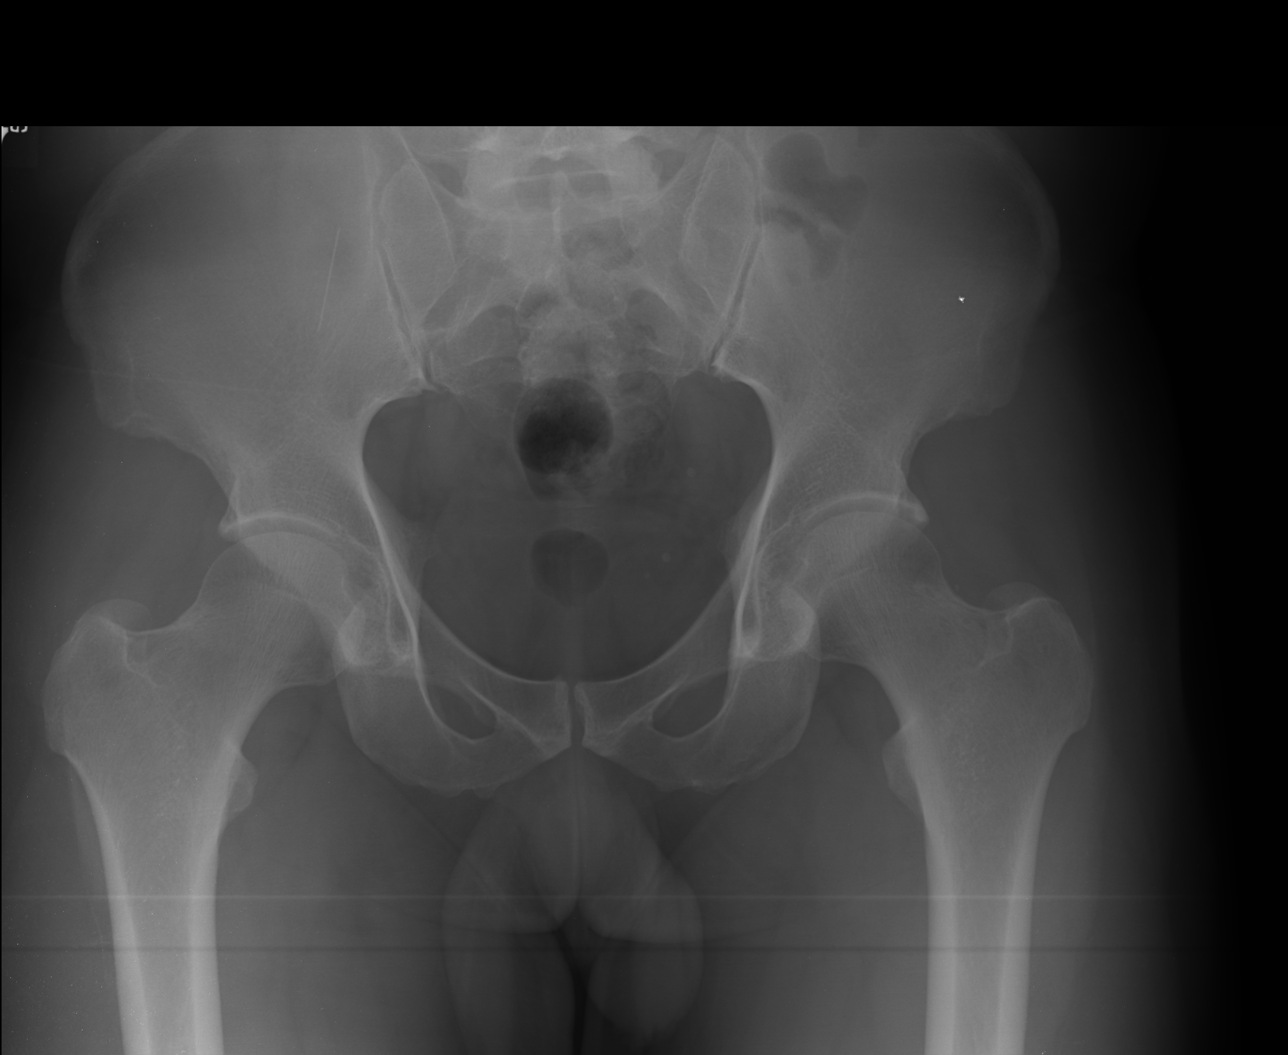

[4 of 4 positions shown; findings below may reference images not displayed]

FINDINGS: Again, on the AP view, linear radiopaque density projects over the
right iliac bone. This is not visualized on the other projections
and is of unknown etiology. If a different cassette was used on
today's study, this is concerning for radiopaque foreign body.
IMPRESSION: Radiopaque foreign body again noted over the right hemipelvis on the
AP view. This is not visualized on additional views. Cassette
artifact remains a possibility if the same cassette was used on the
prior study. If the cassette is different, findings are compatible
with an internal radiopaque foreign body of unknown etiology or
exact location. If further evaluation is felt warranted, CT of the
pelvis without oral or IV contrast may be beneficial.

## 2015-08-25 ENCOUNTER — Ambulatory Visit (INDEPENDENT_AMBULATORY_CARE_PROVIDER_SITE_OTHER): Payer: BLUE CROSS/BLUE SHIELD | Admitting: Emergency Medicine

## 2015-08-25 VITALS — BP 116/74 | HR 78 | Temp 98.4°F | Resp 18 | Wt 187.8 lb

## 2015-08-25 DIAGNOSIS — J309 Allergic rhinitis, unspecified: Secondary | ICD-10-CM | POA: Diagnosis not present

## 2015-08-25 MED ORDER — CETIRIZINE HCL 10 MG PO TABS: 10.0000 mg | ORAL_TABLET | Freq: Every day | ORAL | Status: DC

## 2015-08-25 MED ORDER — FLUTICASONE PROPIONATE 50 MCG/ACT NA SUSP: 2.0000 | Freq: Every day | NASAL | Status: DC

## 2015-08-25 NOTE — Patient Instructions (Signed)
B?nh s?t ma c? kh (Hay Fever) B?nh s?t ma c? kh l ph?n ?ng d? ?ng v?i cc h?t trong khng kh. B?nh khng ly t? ng??i sang ng??i. Khng th? ch?a kh?i h?n b?nh ny nh?ng c th? ki?m sot ???c.  NGUYN NHN B?nh s?t ma c? kh gy b?i ci g ? lm kh?i pht ph?n ?ng d? ?ng (d? nguyn). Sau ?y l nh?ng v d? v? cc d? nguyn:  C? ph?n h??ng.  Lng.  Gu c?a ??ng v?t.  C? v ph?n hoa.  Khi thu?c l.  B?i trong nh.   nhi?m. TRI?U CH?NG  H?t h?i.  Ch?y n??c m?i ho?c ng?t m?i.  Ch?y n??c m?t.  M?t, m?i, mi?ng, h?ng, da ho?c khu v?c khc b? ng?a.  ?au h?ng.  ?au ??u.  Gi?m c?m gic kh?u gic ho?c v? gic. CH?N ?ON Chuyn gia ch?m [place] s?c kh?e s? khm th?c th? v h?i v? cc tri?u ch?ng b?n ?ang c. Xt nghi?m v? d? ?ng c th? ???c th?c hi?n ?? xc ??nh chnh xc nguyn nhn kh?i pht b?nh s?t c? ma kh c?a b?n. ?I?U TR?  Thu?c khng c?n k toa c th? gip gi?m cc tri?u ch?ng. Cc thu?c ny bao g?m:  Thu?c khng histamine.  Thu?c gi?m ng?t m?i. Thu?c ny c th? gip gi?m ngh?t m?i.  N?u nh?ng thu?c khng c?n k toa khng c tc d?ng, chuyn gia ch?m [place] s?c kh?e c th? k ??n cc lo?i thu?c khc.  M?t s? ng??i h??ng l?i t? vi?c chch ng?a d? ?ng khi cc lo?i thu?c khc khng c tc d?ng. H??NG D?N CH?M [place] T?I NH  Trnh d? nguyn gy ra cc tri?u ch?ng c?a b?n, n?u c th?.  S? d?ng t?t c? thu?c theo ch? d?n c?a chuyn gia ch?m [place] s?c kh?e. HY ?I KHM N?U:  B?n c cc tri?u ch?ng d? ?ng nghim tr?ng v cc lo?i thu?c hi?n t?i c?a b?n khng c tc d?ng.  Vi?c ?i?u tr? c lc c hi?u qu?, nh?ng by gi? b?n ?ang c cc tri?u ch?ng.  B?n b? sung huy?t v b? p l?c trong xoang.  B?n b? s?t ho?c ?au ??u.  B?n b? ch?y n??c m?i ??c.  B?n b? hen suy?n v b? ho v th? kh kh n?ng thm. HY NGAY L?P T?C ?I KHM N?U:  B?n b? s?ng l??i ho?c mi.  B?n b? kh th?.  B?n c?m th?y chong vng ho?c c?m th?y nh? s?p ng?t.  B?n ?? m? hi l?nh.  B?n b?  s?t.   Thng tin ny khng nh?m m?c ?ch thay th? cho l?i khuyn m chuyn gia ch?m [place] s?c kh?e ni v?i qu v?. Hy b?o ??m qu v? ph?i th?o lu?n b?t k? v?n ?? g m qu v? c v?i chuyn gia ch?m [place] s?c kh?e c?a qu v?.   Document Released: 06/17/2005 Document Revised: 02/17/2013 Elsevier Interactive Patient Education [employer].

## 2015-08-25 NOTE — Progress Notes (Signed)
By signing my name below, I, Moises Blood, attest that this documentation has been prepared under the direction and in the presence of Arlyss Queen, MD. Electronically Signed: Moises Blood, [place]. 08/25/2015 , 5:00 PM .  Patient was seen in room 14 .  Chief Complaint:  Chief Complaint  Patient presents with  . URI    sore throat, sneezing a lot, runny nose, and eyes watering and itching.     HPI: Ryan Mckinney is a 45 y.o. male who reports to [facility] today complaining of URI symptoms that started 2 weeks ago. He states that it started with rhinorrhea and sneezing. He notes that after he sneezes, his eyes would become itchy and watery. He's had similar symptoms in the past. He denies sore throat.   History reviewed. No pertinent past medical history. Past Surgical History  Procedure Laterality Date  . Nose surgery Right 1999   Social History   Social History  . Marital Status: Single    Spouse Name: N/A  . Number of Children: 2  . Years of Education: N/A   Social History Main Topics  . Smoking status: Never Smoker   . Smokeless tobacco: Never Used  . Alcohol Use: No  . Drug Use: No  . Sexual Activity: Not Asked   Other Topics Concern  . None   Social History Narrative   Family History  Problem Relation Age of Onset  . Colon cancer Neg Hx   . Esophageal cancer Neg Hx   . Rectal cancer Neg Hx   . Stomach cancer Neg Hx    No Known Allergies Prior to Admission medications   Medication Sig Start Date End Date Taking? Authorizing Provider  cyclobenzaprine (FLEXERIL) 10 MG tablet Take 1 tablet at bedtime. Patient not taking: Reported on 08/25/2015 08/26/14   [name], MD  meloxicam (MOBIC) 15 MG tablet Take 1 tablet (15 mg total) by mouth daily. Patient not taking: Reported on 10/07/2014 08/26/14   [name], MD     ROS:  Constitutional: negative for fever, chills, night sweats, weight changes, or fatigue  HEENT: negative for vision changes, hearing loss,  ST, epistaxis, or sinus pressure; positive for rhinorrhea, congestion, sneezing, itchy eyes and eye discharge Cardiovascular: negative for chest pain or palpitations Respiratory: negative for hemoptysis, wheezing, shortness of breath, or cough Abdominal: negative for abdominal pain, nausea, vomiting, diarrhea, or constipation Dermatological: negative for rash Neurologic: negative for headache, dizziness, or syncope All other systems reviewed and are otherwise negative with the exception to those above and in the HPI.  PHYSICAL EXAM: Filed Vitals:   08/25/15 1624  BP: 116/74  Pulse: 78  Temp: 98.4 F (36.9 C)  Resp: 18   Body mass index is 29.86 kg/(m^2).   General: Alert, no acute distress HEENT:  Normocephalic, atraumatic, oropharynx patent; Nasal congestion [facility] Cardiovascular:  Regular rate and rhythm, no rubs murmurs or gallops.  No Carotid bruits, radial pulse intact. No pedal edema.  Respiratory: Clear to auscultation bilaterally.  No wheezes, rales, or rhonchi.  No cyanosis, no use of accessory musculature Abdominal: No organomegaly, abdomen is soft and non-tender, positive bowel sounds. No masses. Musculoskeletal: Gait intact. No edema, tenderness Skin: No rashes. Neurologic: Facial musculature symmetric. Psychiatric: Patient acts appropriately throughout our interaction.  Lymphatic: No cervical or submandibular lymphadenopathy Genitourinary/Anorectal: No acute findings  LABS:   EKG/XRAY:   Primary read interpreted by Dr[facility]lene Farrier at [facility].   ASSESSMENT/PLAN:   patient has signs and  symptoms consistent with seasonal allergic rhinitis. Will treat with Zyrtec and Flonase.I personally performed the services described in this documentation, which was scribed in my presence. The recorded information has been reviewed and is accurate.  Gross sideeffects, risk and benefits, and alternatives of medications d/w patient. Patient is aware that all medications have  potential sideeffects and we are unable to predict every sideeffect or drug-drug interaction that may occur.  Arlyss Queen MD 08/25/2015 5:00 PM

## 2016-11-16 ENCOUNTER — Ambulatory Visit (INDEPENDENT_AMBULATORY_CARE_PROVIDER_SITE_OTHER): Payer: BLUE CROSS/BLUE SHIELD | Admitting: Family Medicine

## 2016-11-16 ENCOUNTER — Encounter: Payer: Self-pay | Admitting: Family Medicine

## 2016-11-16 VITALS — BP 124/80 | HR 103 | Temp 98.5°F | Resp 16 | Ht 66.5 in | Wt 191.6 lb

## 2016-11-16 DIAGNOSIS — H6993 Unspecified Eustachian tube disorder, bilateral: Secondary | ICD-10-CM

## 2016-11-16 DIAGNOSIS — H6983 Other specified disorders of Eustachian tube, bilateral: Secondary | ICD-10-CM | POA: Diagnosis not present

## 2016-11-16 MED ORDER — FLUTICASONE PROPIONATE 50 MCG/ACT NA SUSP: NASAL | 1 refills | Status: DC

## 2016-11-16 MED ORDER — PREDNISONE 20 MG PO TABS: ORAL_TABLET | ORAL | 0 refills | Status: DC

## 2016-11-16 NOTE — Progress Notes (Signed)
Patient ID: Ryan Mckinney, male    DOB: [birth year]  Age: 46 y.o. MRN: [id number]  Chief Complaint  Patient presents with  . Ear Fullness    hard to hear for about two weeks     Subjective:   For the past 2 weeks the patient has been hard of hearing in his ears and feels stuffed up. He thought he might be obstructed.  Current allergies, medications, problem list, past/family and social histories reviewed.  Objective:  BP 124/80 (BP Location: Right Arm, Patient Position: Sitting, Cuff Size: Small)   Pulse (!) 103   Temp 98.5 F (36.9 C) (Oral)   Resp 16   Ht 5' 6.5" (1.689 m)   Wt 191 lb 9.6 oz (86.9 kg)   SpO2 96%   BMI 30.46 kg/m   TMs are entirely normal. Throat clear. Neck supple without significant nodes. No sinus tenderness.  Assessment & Plan:   Assessment: 1. Eustachian tube dysfunction, bilateral       Plan: Consistent with eustachian tube dysfunction. If he is not improved we'll need to send for ENT further evaluation.  No orders of the defined types were placed in this encounter.   Meds ordered this encounter  Medications  . fluticasone (FLONASE) 50 MCG/ACT nasal spray    Sig: Use 2 sprays each nostril twice daily for 5 days, then once daily    Dispense:  16 g    Refill:  1  . predniSONE (DELTASONE) 20 MG tablet    Sig: Take 3 daily for 2 days, then 2 daily for 2 days, then 1 daily for 2 days, then one half daily    Dispense:  12 tablet    Refill:  0         Patient Instructions   Use fluticasone nose spray 2 sprays each nostril twice daily for 5 days, then once daily  Take over-the-counter Claritin-D daily  Take prednisone 3 pills daily for 2 days, then 2 daily for 2 days, then 1 daily for 2 days, then one half daily for 4 days. These should be taken in the morning after breakfast.  If things are getting worse or do not improve over the next week or 2 then we will need to send you to a ear specialist. (ENT)   Eustachian Tube Dysfunction The  eustachian tube connects the middle ear to the back of the nose. It regulates air pressure in the middle ear by allowing air to move between the ear and nose. It also helps to drain fluid from the middle ear space. When the eustachian tube does not function properly, air pressure, fluid, or both can build up in the middle ear. Eustachian tube dysfunction can affect one or both ears. What are the causes? This condition happens when the eustachian tube becomes blocked or cannot open normally. This may result from:  Ear infections.  Colds and other upper respiratory infections.  Allergies.  Irritation, such as from cigarette smoke or acid from the stomach coming up into the esophagus (gastroesophageal reflux).  Sudden changes in air pressure, such as from descending in an airplane.  Abnormal growths in the nose or throat, such as nasal polyps, tumors, or enlarged tissue at the back of the throat (adenoids). What increases the risk? This condition may be more likely to develop in people who smoke and people who are overweight. Eustachian tube dysfunction may also be more likely to develop in children, especially children who have:  Certain birth defects of the  mouth, such as cleft palate.  Large tonsils and adenoids. What are the signs or symptoms? Symptoms of this condition may include:  A feeling of fullness in the ear.  Ear pain.  Clicking or popping noises in the ear.  Ringing in the ear.  Hearing loss.  Loss of balance. Symptoms may get worse when the air pressure around you changes, such as when you travel to an area of high elevation or fly on an airplane. How is this diagnosed? This condition may be diagnosed based on:  Your symptoms.  A physical exam of your ear, nose, and throat.  Tests, such as those that measure:  The movement of your eardrum (tympanogram).  Your hearing (audiometry). How is this treated? Treatment depends on the cause and severity of your  condition. If your symptoms are mild, you may be able to relieve your symptoms by moving air into ("popping") your ears. If you have symptoms of fluid in your ears, treatment may include:  Decongestants.  Antihistamines.  Nasal sprays or ear drops that contain medicines that reduce swelling (steroids). In some cases, you may need to have a procedure to drain the fluid in your eardrum (myringotomy). In this procedure, a small tube is placed in the eardrum to:  Drain the fluid.  Restore the air in the middle ear space. Follow these instructions at home:  Take over-the-counter and prescription medicines only as told by your health care provider.  Use techniques to help pop your ears as recommended by your health care provider. These may include:  Chewing gum.  Yawning.  Frequent, forceful swallowing.  Closing your mouth, holding your nose closed, and gently blowing as if you are trying to blow air out of your nose.  Do not do any of the following until your health care provider approves:  Travel to high altitudes.  Fly in airplanes.  Work in a Pension scheme manager or room.  Scuba dive.  Keep your ears dry. Dry your ears completely after showering or bathing.  Do not smoke.  Keep all follow-up visits as told by your health care provider. This is important. Contact a health care provider if:  Your symptoms do not go away after treatment.  Your symptoms come back after treatment.  You are unable to pop your ears.  You have:  A fever.  Pain in your ear.  Pain in your head or neck.  Fluid draining from your ear.  Your hearing suddenly changes.  You become very dizzy.  You lose your balance. This information is not intended to replace advice given to you by your health care provider. Make sure you discuss any questions you have with your health care provider. Document Released: 07/14/2015 Document Revised: 11/23/2015 Document Reviewed: 07/06/2014 Elsevier  Interactive Patient Education  2017 [employer].    IF you received an x-ray today, you will receive an invoice from [facility]. Please contact [facility] at [phone] with questions or concerns regarding your invoice.   IF you received labwork today, you will receive an invoice from [facility]. Please contact [facility] at [phone] with questions or concerns regarding your invoice.   Our billing staff will not be able to assist you with questions regarding bills from these companies.  You will be contacted with the lab results as soon as they are available. The fastest way to get your results is to activate your My Chart account. Instructions are located on the last page of this paperwork. If you have not heard from  [place] regarding the results in 2 weeks, please contact this office.         Return if symptoms worsen or fail to improve.   Naiah Donahoe, MD 11/16/2016

## 2016-11-16 NOTE — Patient Instructions (Addendum)
Use fluticasone nose spray 2 sprays each nostril twice daily for 5 days, then once daily  Take over-the-counter Claritin-D daily  Take prednisone 3 pills daily for 2 days, then 2 daily for 2 days, then 1 daily for 2 days, then one half daily for 4 days. These should be taken in the morning after breakfast.  If things are getting worse or do not improve over the next week or 2 then we will need to send you to a ear specialist. (ENT)   Eustachian Tube Dysfunction The eustachian tube connects the middle ear to the back of the nose. It regulates air pressure in the middle ear by allowing air to move between the ear and nose. It also helps to drain fluid from the middle ear space. When the eustachian tube does not function properly, air pressure, fluid, or both can build up in the middle ear. Eustachian tube dysfunction can affect one or both ears. What are the causes? This condition happens when the eustachian tube becomes blocked or cannot open normally. This may result from:  Ear infections.  Colds and other upper respiratory infections.  Allergies.  Irritation, such as from cigarette smoke or acid from the stomach coming up into the esophagus (gastroesophageal reflux).  Sudden changes in air pressure, such as from descending in an airplane.  Abnormal growths in the nose or throat, such as nasal polyps, tumors, or enlarged tissue at the back of the throat (adenoids). What increases the risk? This condition may be more likely to develop in people who smoke and people who are overweight. Eustachian tube dysfunction may also be more likely to develop in children, especially children who have:  Certain birth defects of the mouth, such as cleft palate.  Large tonsils and adenoids. What are the signs or symptoms? Symptoms of this condition may include:  A feeling of fullness in the ear.  Ear pain.  Clicking or popping noises in the ear.  Ringing in the ear.  Hearing loss.  Loss  of balance. Symptoms may get worse when the air pressure around you changes, such as when you travel to an area of high elevation or fly on an airplane. How is this diagnosed? This condition may be diagnosed based on:  Your symptoms.  A physical exam of your ear, nose, and throat.  Tests, such as those that measure:  The movement of your eardrum (tympanogram).  Your hearing (audiometry). How is this treated? Treatment depends on the cause and severity of your condition. If your symptoms are mild, you may be able to relieve your symptoms by moving air into ("popping") your ears. If you have symptoms of fluid in your ears, treatment may include:  Decongestants.  Antihistamines.  Nasal sprays or ear drops that contain medicines that reduce swelling (steroids). In some cases, you may need to have a procedure to drain the fluid in your eardrum (myringotomy). In this procedure, a small tube is placed in the eardrum to:  Drain the fluid.  Restore the air in the middle ear space. Follow these instructions at home:  Take over-the-counter and prescription medicines only as told by your health care provider.  Use techniques to help pop your ears as recommended by your health care provider. These may include:  Chewing gum.  Yawning.  Frequent, forceful swallowing.  Closing your mouth, holding your nose closed, and gently blowing as if you are trying to blow air out of your nose.  Do not do any of the following until  your health care provider approves:  Travel to high altitudes.  Fly in airplanes.  Work in a Pension scheme manager or room.  Scuba dive.  Keep your ears dry. Dry your ears completely after showering or bathing.  Do not smoke.  Keep all follow-up visits as told by your health care provider. This is important. Contact a health care provider if:  Your symptoms do not go away after treatment.  Your symptoms come back after treatment.  You are unable to pop your  ears.  You have:  A fever.  Pain in your ear.  Pain in your head or neck.  Fluid draining from your ear.  Your hearing suddenly changes.  You become very dizzy.  You lose your balance. This information is not intended to replace advice given to you by your health care provider. Make sure you discuss any questions you have with your health care provider. Document Released: 07/14/2015 Document Revised: 11/23/2015 Document Reviewed: 07/06/2014 Elsevier Interactive Patient Education  2017 [employer].    IF you received an x-ray today, you will receive an invoice from [facility]. Please contact [facility] at [phone] with questions or concerns regarding your invoice.   IF you received labwork today, you will receive an invoice from [facility]. Please contact [facility] at [phone] with questions or concerns regarding your invoice.   Our billing staff will not be able to assist you with questions regarding bills from these companies.  You will be contacted with the lab results as soon as they are available. The fastest way to get your results is to activate your My Chart account. Instructions are located on the last page of this paperwork. If you have not heard from [place] regarding the results in 2 weeks, please contact this office.

## 2017-04-16 ENCOUNTER — Encounter (INDEPENDENT_AMBULATORY_CARE_PROVIDER_SITE_OTHER): Payer: Self-pay

## 2017-04-16 ENCOUNTER — Ambulatory Visit (INDEPENDENT_AMBULATORY_CARE_PROVIDER_SITE_OTHER): Payer: BLUE CROSS/BLUE SHIELD | Admitting: Gastroenterology

## 2017-04-16 ENCOUNTER — Encounter: Payer: Self-pay | Admitting: Gastroenterology

## 2017-04-16 VITALS — BP 132/72 | HR 68 | Ht 67.0 in | Wt 185.5 lb

## 2017-04-16 DIAGNOSIS — K600 Acute anal fissure: Secondary | ICD-10-CM

## 2017-04-16 DIAGNOSIS — K5900 Constipation, unspecified: Secondary | ICD-10-CM | POA: Diagnosis not present

## 2017-04-16 MED ORDER — NONFORMULARY OR COMPOUNDED ITEM: 4 refills | Status: AC

## 2017-04-16 NOTE — Progress Notes (Signed)
I agree with the above note, plan 

## 2017-04-16 NOTE — Progress Notes (Signed)
04/16/2017 Ryan Mckinney [id number] [birth year]   HISTORY OF PRESENT ILLNESS:  This is a pleasant 46 year old [place] male who is known to Dr. Ardis Hughs.  He had a colonoscopy in April 2016 at which time he was found to have 3 polyps that were removed. They were between 3 and 5 mm.  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 He also had an EGD at that time that showed nonspecific gastritis. Biopsies were negative. H. Pylori.  He presents for office today with complaints of left sided abdominal discomfort, rectal bleeding, rectal pain, and constipation.  He tells me that he feels a lump at his anus.  Hurts to have BM's often.  Sees small amounts of bright red blood on the toilet paper with BM's at times.  Moves his bowels once or twice a day but often has to strain and passes hard stools.  Does not use anything for constipation.     No past medical history on file. Past Surgical History:  Procedure Laterality Date  . NOSE SURGERY Right 1999    reports that he has never smoked. He has never used smokeless tobacco. He reports that he does not drink alcohol or use drugs. family history is not on file. Not on File    Outpatient Encounter Prescriptions as of 04/16/2017  Medication Sig  . [DISCONTINUED] cetirizine (ZYRTEC) 10 MG tablet Take 1 tablet (10 mg total) by mouth daily.  . [DISCONTINUED] cyclobenzaprine (FLEXERIL) 10 MG tablet Take 1 tablet at bedtime. (Patient not taking: Reported on 08/25/2015)  . [DISCONTINUED] fluticasone (FLONASE) 50 MCG/ACT nasal spray Use 2 sprays each nostril twice daily for 5 days, then once daily  . [DISCONTINUED] meloxicam (MOBIC) 15 MG tablet Take 1 tablet (15 mg total) by mouth daily. (Patient not taking: Reported on 10/07/2014)  . [DISCONTINUED] predniSONE (DELTASONE) 20 MG tablet Take 3 daily for 2 days, then 2 daily for 2 days, then 1 daily for 2 days, then one half daily   No  facility-administered encounter medications on file as of 04/16/2017.      REVIEW OF SYSTEMS  : All other systems reviewed and negative except where noted in the History of Present Illness.   PHYSICAL EXAM: BP 132/72   Pulse 68   Ht 5\' 7"  (1.702 m)   Wt 185 lb 8 oz (84.1 kg)   BMI 29.05 kg/m  General: Well developed [place] male in no acute distress Head: Normocephalic and atraumatic Eyes:  Sclerae anicteric, conjunctiva pink. Ears: Normal auditory acuity Lungs: Clear throughout to auscultation; no increased WOB. Heart: Regular rate and rhythm; no M/R/G. Abdomen: Soft, non-distended.  BS present.  Minimal LLQ TTP. Rectal:  Anterior skin tag noted.  There was a fissure noted anteriorly as well.  DRE did not reveal any masses.  Light brown stool on exam glove was not hemocculted. Musculoskeletal: Symmetrical with no gross deformities  Skin: No lesions on visible extremities Extremities: No edema  Neurological: Alert oriented x 4, grossly non-focal Psychological:  Alert and cooperative. Normal mood and affect  ASSESSMENT AND PLAN: *Rectal pain/bleeding:  Anterior fissure noted.  Likely due to constipation/straining.  Will treat with nitroglycerin 0.125% TID for 8-10 weeks. *Constipation:  Need to treat this in order to heal and prevent recurrence of fissure.  I have asked him to begin Miralax once daily.   *Left sided abdominal pain:  Seems mild, minimal tenderness  in LLQ on exam today.  Possibly from constipation.  **He will call back in about 4 weeks with an update of his symptoms.   CC:  No ref. provider found

## 2017-04-16 NOTE — Patient Instructions (Signed)
If you are age 47 or older, your body mass index should be between 23-30. Your Body mass index is 29.05 kg/m. If this is out of the aforementioned range listed, please consider follow up with your Primary Care Provider.  If you are age 57 or younger, your body mass index should be between 19-25. Your Body mass index is 29.05 kg/m. If this is out of the aformentioned range listed, please consider follow up with your Primary Care Provider.   We have sent Nitroglycerin Gel to [facility] for you to pick up.  Please start Miralax daily.  Call back in 4 weeks with an update and as for [employer].  Thank you.

## 2017-04-17 ENCOUNTER — Telehealth: Payer: Self-pay | Admitting: Gastroenterology

## 2017-04-18 NOTE — Telephone Encounter (Signed)
Left message informing pt that I spoke with the pharmacist at [facility] about the Nitroglycerin. [facility] should have it for pt. Instructed pt that this pharmacy compounds this medication for [place] and to call back with any concerns

## 2017-09-23 ENCOUNTER — Encounter: Payer: Self-pay | Admitting: Gastroenterology

## 2018-11-09 ENCOUNTER — Emergency Department (HOSPITAL_COMMUNITY)
Admission: EM | Admit: 2018-11-09 | Discharge: 2018-11-09 | Disposition: A | Discharge status: Home / Self Care | Payer: BLUE CROSS/BLUE SHIELD | Attending: Emergency Medicine | Admitting: Emergency Medicine

## 2018-11-09 ENCOUNTER — Other Ambulatory Visit: Payer: Self-pay

## 2018-11-09 ENCOUNTER — Encounter (HOSPITAL_COMMUNITY): Payer: Self-pay

## 2018-11-09 DIAGNOSIS — R112 Nausea with vomiting, unspecified: Secondary | ICD-10-CM | POA: Diagnosis not present

## 2018-11-09 DIAGNOSIS — R52 Pain, unspecified: Secondary | ICD-10-CM | POA: Diagnosis not present

## 2018-11-09 DIAGNOSIS — H9203 Otalgia, bilateral: Secondary | ICD-10-CM | POA: Diagnosis not present

## 2018-11-09 DIAGNOSIS — R0902 Hypoxemia: Secondary | ICD-10-CM | POA: Diagnosis not present

## 2018-11-09 DIAGNOSIS — I10 Essential (primary) hypertension: Secondary | ICD-10-CM | POA: Diagnosis not present

## 2018-11-09 DIAGNOSIS — R42 Dizziness and giddiness: Secondary | ICD-10-CM | POA: Diagnosis not present

## 2018-11-09 LAB — BASIC METABOLIC PANEL
Anion gap: 11 (ref 5–15)
BUN: 15 mg/dL (ref 6–20)
CO2: 23 mmol/L (ref 22–32)
Calcium: 9 mg/dL (ref 8.9–10.3)
Chloride: 105 mmol/L (ref 98–111)
Creatinine, Ser: 1.18 mg/dL (ref 0.61–1.24)
GFR calc Af Amer: >60 mL/min (ref >60)
GFR calc non Af Amer: >60 mL/min (ref >60)
Glucose, Bld: 114 mg/dL — ABNORMAL HIGH (ref 70–99)
Potassium: 3.4 mmol/L — ABNORMAL LOW (ref 3.5–5.1)
Sodium: 139 mmol/L (ref 135–145)

## 2018-11-09 LAB — CBC
HCT: 48.7 % (ref 39.0–52.0)
Hemoglobin: 15.6 g/dL (ref 13.0–17.0)
MCH: 26.4 pg (ref 26.0–34.0)
MCHC: 32 g/dL (ref 30.0–36.0)
MCV: 82.4 fL (ref 80.0–100.0)
Platelets: 269 10*3/uL (ref 150–400)
RBC: 5.91 MIL/uL — ABNORMAL HIGH (ref 4.22–5.81)
RDW: 12.9 % (ref 11.5–15.5)
WBC: 12.7 10*3/uL — ABNORMAL HIGH (ref 4.0–10.5)
nRBC: 0 % (ref 0.0–0.2)

## 2018-11-09 MED ORDER — MECLIZINE HCL 25 MG PO TABS
25.0000 mg | ORAL_TABLET | Freq: Once | ORAL | Status: AC
  Administered 2018-11-09: 25 mg via ORAL
  Filled 2018-11-09: qty 1

## 2018-11-09 MED ORDER — MECLIZINE HCL 25 MG PO TABS: 25.0000 mg | ORAL_TABLET | Freq: Three times a day (TID) | ORAL | 0 refills | Status: DC | PRN

## 2018-11-09 MED ORDER — IBUPROFEN 400 MG PO TABS: 400.0000 mg | ORAL_TABLET | Freq: Four times a day (QID) | ORAL | 0 refills | Status: AC | PRN

## 2018-11-09 MED ORDER — KETOROLAC TROMETHAMINE 60 MG/2ML IM SOLN
60.0000 mg | Freq: Once | INTRAMUSCULAR | Status: AC
  Administered 2018-11-09: 60 mg via INTRAMUSCULAR
  Filled 2018-11-09: qty 2

## 2018-11-09 MED ORDER — SODIUM CHLORIDE 0.9 % IV BOLUS
1000.0000 mL | Freq: Once | INTRAVENOUS | Status: AC
  Administered 2018-11-09: 14:00:00 1000 mL via INTRAVENOUS

## 2018-11-09 NOTE — ED Provider Notes (Signed)
[facility] EMERGENCY DEPARTMENT Provider Note   CSN: [id number] Arrival date & time: 11/09/18  1243    History   Chief Complaint Chief Complaint  Patient presents with  . Dizziness  . Otalgia    HPI Ryan Mckinney is a 48 y.o. male.  He has no significant past medical history.  He speaks [place] but also [place] and is comfortable in [place].  He is complaining of acute onset of dizziness room spinning that occurred while he was at work a few hours ago.  It was associated with nausea and is vomited 3 or 4 times.  He is never had this before.  He is also complaining of bilateral ear pain.  No visual symptoms no numbness no weakness no fever chills chest pain abdominal pain.  No reported trauma.     The history is provided by the patient.  Dizziness  Quality:  Room spinning Severity:  Moderate Onset quality:  Sudden Timing:  Constant Progression:  Unchanged Chronicity:  New Context: head movement   Relieved by:  Being still Worsened by:  Movement and turning head Ineffective treatments:  None tried Associated symptoms: nausea, tinnitus and vomiting   Associated symptoms: no chest pain, no headaches, no shortness of breath, no syncope, no vision changes and no weakness   Risk factors: no heart disease, no hx of stroke, no hx of vertigo, no multiple medications and no new medications     History reviewed. No pertinent past medical history.  Patient Active Problem List   Diagnosis Date Noted  . Acute anterior anal fissure 04/16/2017  . Constipation 04/16/2017  . Back pain 08/26/2014  . Abdominal pain, left lower quadrant 08/26/2014  . Foreign body (FB) in soft tissue 08/26/2014    Past Surgical History:  Procedure Laterality Date  . NOSE SURGERY Right 1999        Home Medications    Prior to Admission medications   Medication Sig Start Date End Date Taking? Authorizing Provider  NONFORMULARY OR COMPOUNDED ITEM Nitroglycerin Gel  0.125% Apply three times daily for 8 weeks 04/16/17   Zehr, Laban Emperor, PA-C    Family History Family History  Problem Relation Age of Onset  . Colon cancer Neg Hx   . Esophageal cancer Neg Hx   . Rectal cancer Neg Hx   . Stomach cancer Neg Hx     Social History Social History   Tobacco Use  . Smoking status: Never Smoker  . Smokeless tobacco: Never Used  Substance Use Topics  . Alcohol use: No    Alcohol/week: 0.0 standard drinks  . Drug use: No     Allergies   Patient has no allergy information on record.   Review of Systems Review of Systems  Constitutional: Negative for fever.  HENT: Positive for ear pain and tinnitus. Negative for sore throat.   Eyes: Negative for visual disturbance.  Respiratory: Negative for shortness of breath.   Cardiovascular: Negative for chest pain and syncope.  Gastrointestinal: Positive for nausea and vomiting. Negative for abdominal pain.  Genitourinary: Negative for dysuria.  Musculoskeletal: Negative for neck pain.  Skin: Negative for rash.  Neurological: Positive for dizziness. Negative for weakness and headaches.     Physical Exam Updated Vital Signs BP (!) 158/89 (BP Location: Right Arm)   Pulse 67   Temp 98.7 F (37.1 C) (Oral)   Resp 14   Ht 5\' 6"  (1.676 m)   Wt 88.5 kg   SpO2 98%   BMI  31.47 kg/m   Physical Exam Vitals signs and nursing note reviewed.  Constitutional:      Appearance: He is well-developed.  HENT:     Head: Normocephalic and atraumatic.     Right Ear: Tympanic membrane and ear canal normal.     Left Ear: Tympanic membrane and ear canal normal.     Nose: Nose normal.     Mouth/Throat:     Mouth: Mucous membranes are moist.     Pharynx: Oropharynx is clear.  Eyes:     Extraocular Movements: Extraocular movements intact.     Right eye: Nystagmus present.     Left eye: Nystagmus present.     Conjunctiva/sclera: Conjunctivae normal.     Pupils: Pupils are equal, round, and reactive to light.   Neck:     Musculoskeletal: Neck supple.  Cardiovascular:     Rate and Rhythm: Normal rate and regular rhythm.     Heart sounds: No murmur.  Pulmonary:     Effort: Pulmonary effort is normal. No respiratory distress.     Breath sounds: Normal breath sounds.  Abdominal:     Palpations: Abdomen is soft.     Tenderness: There is no abdominal tenderness.  Musculoskeletal: Normal range of motion.     Right lower leg: No edema.     Left lower leg: No edema.  Skin:    General: Skin is warm and dry.     Capillary Refill: Capillary refill takes less than 2 seconds.  Neurological:     General: No focal deficit present.     Mental Status: He is alert and oriented to person, place, and time.     Sensory: No sensory deficit.     Motor: No weakness.      ED Treatments / Results  Labs (all labs ordered are listed, but only abnormal results are displayed) Labs Reviewed  BASIC METABOLIC PANEL - Abnormal; Notable for the following components:      Result Value   Potassium 3.4 (*)    Glucose, Bld 114 (*)    All other components within normal limits  CBC - Abnormal; Notable for the following components:   WBC 12.7 (*)    RBC 5.91 (*)    All other components within normal limits  CBG MONITORING, ED    EKG EKG Interpretation  Date/Time:  Monday Nov 09 2018 12:47:48 EDT Ventricular Rate:  61 PR Interval:  162 QRS Duration: 92 QT Interval:  386 QTC Calculation: 388 R Axis:   -5 Text Interpretation:  Normal sinus rhythm Normal ECG no prior to compare with Confirmed by Aletta Edouard [phone]) on 11/09/2018 2:01:18 PM   Radiology No results found.  Procedures Procedures (including critical care time)  Medications Ordered in ED Medications  sodium chloride 0.9 % bolus 1,000 mL (has no administration in time range)  meclizine (ANTIVERT) tablet 25 mg (has no administration in time range)     Initial Impression / Assessment and Plan / ED Course  I have reviewed the triage vital  signs and the nursing notes.  Pertinent labs & imaging results that were available during my care of the patient were reviewed by me and considered in my medical decision making (see chart for details).  Clinical Course as of Nov 08 1852  Mon Nov 09, 2018  1459 Healthy 48 year old male here with acute onset of room spinning associated with nausea vomiting and ear pain.  No prior history of same no trauma.  Differential includes vertigo, stroke,  dehydration, metabolic derangement.  He is getting some IV fluids and meclizine.  Plan is for reevaluation and if not improved consider MRI.   [MB]    Clinical Course User Index [MB] Hayden Rasmussen, MD   Ryan Mckinney was evaluated in Emergency Department on 11/09/2018 for the symptoms described in the history of present illness. He was evaluated in the context of the global COVID-19 pandemic, which necessitated consideration that the patient might be at risk for infection with the SARS-CoV-2 virus that causes COVID-19. Institutional protocols and algorithms that pertain to the evaluation of patients at risk for COVID-19 are in a state of rapid change based on information released by regulatory bodies including the CDC and federal and state organizations. These policies and algorithms were followed during the patient's care in the ED.       Final Clinical Impressions(s) / ED Diagnoses   Final diagnoses:  Vertigo    ED Discharge Orders         Ordered    meclizine (ANTIVERT) 25 MG tablet  3 times daily PRN     11/09/18 1656    ibuprofen (ADVIL) 400 MG tablet  Every 6 hours PRN     11/09/18 1656           Hayden Rasmussen, MD 11/09/18 [phone]

## 2018-11-09 NOTE — ED Notes (Signed)
Patient ambulated, stated that the dizziness is gone, still complains of ear pain.

## 2018-11-09 NOTE — ED Notes (Signed)
Patient verbalizes understanding of discharge instructions. Opportunity for questioning and answers were provided. Armband removed by staff, pt discharged from ED ambulatory by self\  

## 2018-11-09 NOTE — ED Provider Notes (Signed)
3:02 PM Assumed care from Dr. Melina Copa, please see their note for full history, physical and decision making until this point. In brief this is a 48 y.o. year old male who presented to the ED tonight with Dizziness and Otalgia      Likely vertigo. Fluids/meclizine. Discharge if improved.  Patient is feeling much better.  Will attempt to ambulate but will likely be discharged soon.  Ambulated well. Vertigo improved. Still some ear pain.   Stable for discharge.   Discharge instructions, including strict return precautions for new or worsening symptoms, given. Patient and/or family verbalized understanding and agreement with the plan as described.   Labs, studies and imaging reviewed by myself and considered in medical decision making if ordered. Imaging interpreted by radiology.  Labs Reviewed  BASIC METABOLIC PANEL - Abnormal; Notable for the following components:      Result Value   Potassium 3.4 (*)    Glucose, Bld 114 (*)    All other components within normal limits  CBC - Abnormal; Notable for the following components:   WBC 12.7 (*)    RBC 5.91 (*)    All other components within normal limits  CBG MONITORING, ED    No orders to display    No follow-ups on file.    Merrily Pew, MD 11/09/18 [phone]

## 2018-11-09 NOTE — ED Triage Notes (Addendum)
Pt reports to ED via EMS, chief complaint dizziness and bilateral ear pain accompanied by nausea vomiting. Pt states, "feels like room is spinning". Pt states he has vomited 3-4 times PTA. EMS reports zofran given x 1, with no improvement of nausea.

## 2019-02-06 ENCOUNTER — Other Ambulatory Visit: Payer: Self-pay | Admitting: Radiology

## 2019-02-06 DIAGNOSIS — Z20822 Contact with and (suspected) exposure to covid-19: Secondary | ICD-10-CM

## 2019-02-07 LAB — NOVEL CORONAVIRUS, NAA: SARS-CoV-2, NAA: DETECTED — AB

## 2019-02-09 ENCOUNTER — Telehealth: Payer: Self-pay | Admitting: Critical Care Medicine

## 2019-02-09 NOTE — Telephone Encounter (Signed)
I connected with this patient today and he is still asymptomatic.  I informed him he needs to isolate until 18 August and he understands this.  He has been isolating since he had the test over the weekend.  He also knows the health department may be in touch with him.  I also offered him a phone number to call to inquire about potential primary care as he does not have a primary care physician.

## 2019-02-17 ENCOUNTER — Other Ambulatory Visit: Payer: Self-pay

## 2019-02-17 DIAGNOSIS — Z20822 Contact with and (suspected) exposure to covid-19: Secondary | ICD-10-CM

## 2019-02-18 LAB — NOVEL CORONAVIRUS, NAA: SARS-CoV-2, NAA: NOT DETECTED

## 2021-08-25 ENCOUNTER — Encounter (HOSPITAL_COMMUNITY): Payer: Self-pay

## 2021-08-25 ENCOUNTER — Other Ambulatory Visit: Payer: Self-pay

## 2021-08-25 ENCOUNTER — Emergency Department (HOSPITAL_COMMUNITY)
Admission: EM | Admit: 2021-08-25 | Discharge: 2021-08-25 | Disposition: A | Discharge status: Home / Self Care | Payer: BC Managed Care – PPO | Attending: Emergency Medicine | Admitting: Emergency Medicine

## 2021-08-25 DIAGNOSIS — R0789 Other chest pain: Secondary | ICD-10-CM | POA: Insufficient documentation

## 2021-08-25 DIAGNOSIS — R079 Chest pain, unspecified: Secondary | ICD-10-CM

## 2021-08-25 DIAGNOSIS — R42 Dizziness and giddiness: Secondary | ICD-10-CM

## 2021-08-25 DIAGNOSIS — H9202 Otalgia, left ear: Secondary | ICD-10-CM | POA: Diagnosis present

## 2021-08-25 DIAGNOSIS — R0600 Dyspnea, unspecified: Secondary | ICD-10-CM | POA: Diagnosis not present

## 2021-08-25 DIAGNOSIS — H66002 Acute suppurative otitis media without spontaneous rupture of ear drum, left ear: Secondary | ICD-10-CM

## 2021-08-25 LAB — CBC
HCT: 46.4 % (ref 39.0–52.0)
Hemoglobin: 15.3 g/dL (ref 13.0–17.0)
MCH: 27 pg (ref 26.0–34.0)
MCHC: 33 g/dL (ref 30.0–36.0)
MCV: 81.8 fL (ref 80.0–100.0)
Platelets: 316 10*3/uL (ref 150–400)
RBC: 5.67 MIL/uL (ref 4.22–5.81)
RDW: 13.3 % (ref 11.5–15.5)
WBC: 13.6 10*3/uL — ABNORMAL HIGH (ref 4.0–10.5)
nRBC: 0 % (ref 0.0–0.2)

## 2021-08-25 LAB — URINALYSIS, ROUTINE W REFLEX MICROSCOPIC
Bilirubin Urine: NEGATIVE
Glucose, UA: NEGATIVE mg/dL
Hgb urine dipstick: NEGATIVE
Ketones, ur: NEGATIVE mg/dL
Leukocytes,Ua: NEGATIVE
Nitrite: NEGATIVE
Protein, ur: NEGATIVE mg/dL
Specific Gravity, Urine: 1.018 (ref 1.005–1.030)
pH: 6 (ref 5.0–8.0)

## 2021-08-25 LAB — COMPREHENSIVE METABOLIC PANEL
ALT: 18 U/L (ref 0–44)
AST: 31 U/L (ref 15–41)
Albumin: 3.8 g/dL (ref 3.5–5.0)
Alkaline Phosphatase: 53 U/L (ref 38–126)
Anion gap: 10 (ref 5–15)
BUN: 14 mg/dL (ref 6–20)
CO2: 20 mmol/L — ABNORMAL LOW (ref 22–32)
Calcium: 8.7 mg/dL — ABNORMAL LOW (ref 8.9–10.3)
Chloride: 106 mmol/L (ref 98–111)
Creatinine, Ser: 1.09 mg/dL (ref 0.61–1.24)
GFR, Estimated: >60 mL/min (ref >60)
Glucose, Bld: 125 mg/dL — ABNORMAL HIGH (ref 70–99)
Potassium: 4.5 mmol/L (ref 3.5–5.1)
Sodium: 136 mmol/L (ref 135–145)
Total Bilirubin: 1.2 mg/dL (ref 0.3–1.2)
Total Protein: 7.3 g/dL (ref 6.5–8.1)

## 2021-08-25 LAB — LIPASE, BLOOD: Lipase: 34 U/L (ref 11–51)

## 2021-08-25 LAB — TROPONIN I (HIGH SENSITIVITY): Troponin I (High Sensitivity): 4 ng/L (ref <18)

## 2021-08-25 MED ORDER — DIAZEPAM 5 MG/ML IJ SOLN
2.5000 mg | Freq: Once | INTRAMUSCULAR | Status: AC
  Administered 2021-08-25: 2.5 mg via INTRAVENOUS
  Filled 2021-08-25: qty 2

## 2021-08-25 MED ORDER — ONDANSETRON 4 MG PO TBDP: 4.0000 mg | ORAL_TABLET | Freq: Three times a day (TID) | ORAL | 0 refills | Status: AC | PRN

## 2021-08-25 MED ORDER — AMOXICILLIN-POT CLAVULANATE 875-125 MG PO TABS: 1.0000 | ORAL_TABLET | Freq: Two times a day (BID) | ORAL | 0 refills | Status: AC

## 2021-08-25 MED ORDER — LACTATED RINGERS IV BOLUS
1000.0000 mL | Freq: Once | INTRAVENOUS | Status: AC
  Administered 2021-08-25: 1000 mL via INTRAVENOUS

## 2021-08-25 MED ORDER — MECLIZINE HCL 25 MG PO TABS: 25.0000 mg | ORAL_TABLET | Freq: Three times a day (TID) | ORAL | 0 refills | Status: AC | PRN

## 2021-08-25 MED ORDER — MECLIZINE HCL 25 MG PO TABS
50.0000 mg | ORAL_TABLET | Freq: Once | ORAL | Status: AC
  Administered 2021-08-25: 50 mg via ORAL
  Filled 2021-08-25: qty 2

## 2021-08-25 NOTE — ED Triage Notes (Signed)
Pt bib [facility] from home with complaints of left ear pain x 1 week and ha/dizziness/n/v that started today. AOx4 VSS EMS vitals: 148/98, 72HR, 20R, 96%RA, 1496CBG, 4mg  zofran en route

## 2021-08-25 NOTE — ED Notes (Signed)
Pt verbalizes understanding of discharge instructions. Opportunity for questions and answers were provided. Pt discharged from the ED.   ?

## 2021-08-25 NOTE — ED Provider Notes (Signed)
[facility] Provider Note   CSN: [id number] Arrival date & time: 08/25/21  1725     History  Chief Complaint  Patient presents with   Dizziness    Ryan Mckinney is a 51 y.o. male.  HPI     51yo male with history of prior episode of vertigo in May 2020 who presents with concern for vertigo, left ear pain, nausea, vomiting, also reports chest pain.   Room spinning, constant Closing eyes helps Worse with head movements Ear ringing and pain   Dizziness started 5PM Nausea, vomiting  Chest pain, dyspnea , tightness Heaviness in chest No dyspnea 2006, drank alcohol, ear hurting  Same symptoms as 2020  Denies numbness, weakness, difficulty talking or walking, visual changes or facial droop.  No headache, only dizziness  No fever, cough, abdominal pain, no black or bloody stools, no diarrhea, leg pain or swelling   Sneezing and runny nose  No smoking, drug use, or family hx of early CVA No hx of medical problems Has ha prior vertigo/ear ringing  [place] interpreter used    History reviewed. No pertinent past medical history.  Past Surgical History:  Procedure Laterality Date   NOSE SURGERY Right 1999     Home Medications Prior to Admission medications   Medication Sig Start Date End Date Taking? Authorizing Provider  amoxicillin-clavulanate (AUGMENTIN) 875-125 MG tablet Take 1 tablet by mouth every 12 (twelve) hours for 10 days. 08/25/21 09/04/21 Yes Gareth Morgan, MD  meclizine (ANTIVERT) 25 MG tablet Take 1 tablet (25 mg total) by mouth 3 (three) times daily as needed for dizziness. 08/25/21  Yes Gareth Morgan, MD  ondansetron (ZOFRAN-ODT) 4 MG disintegrating tablet Take 1 tablet (4 mg total) by mouth every 8 (eight) hours as needed for nausea or vomiting. 08/25/21  Yes Gareth Morgan, MD  ibuprofen (ADVIL) 400 MG tablet Take 1 tablet (400 mg total) by mouth every 6 (six) hours as needed. 11/09/18   Mesner, Corene Cornea, MD   NONFORMULARY OR COMPOUNDED ITEM Nitroglycerin Gel 0.125% Apply three times daily for 8 weeks 04/16/17   Zehr, Laban Emperor, PA-C      Allergies    Patient has no allergy information on record.    Review of Systems   Review of Systems  Physical Exam Updated Vital Signs BP (!) 149/95 (BP Location: Left Arm)    Pulse 71    Temp 97.7 F (36.5 C) (Oral)    Resp 18    Ht 5\' 6"  (1.676 m)    Wt 90.7 kg    SpO2 99%    BMI 32.28 kg/m  Physical Exam  ED Results / Procedures / Treatments   Labs (all labs ordered are listed, but only abnormal results are displayed) Labs Reviewed  COMPREHENSIVE METABOLIC PANEL - Abnormal; Notable for the following components:      Result Value   CO2 20 (*)    Glucose, Bld 125 (*)    Calcium 8.7 (*)    All other components within normal limits  CBC - Abnormal; Notable for the following components:   WBC 13.6 (*)    All other components within normal limits  LIPASE, BLOOD  URINALYSIS, ROUTINE W REFLEX MICROSCOPIC  TROPONIN I (HIGH SENSITIVITY)  TROPONIN I (HIGH SENSITIVITY)    EKG EKG Interpretation  Date/Time:  Saturday August 25 2021 17:35:34 EST Ventricular Rate:  66 PR Interval:  163 QRS Duration: 102 QT Interval:  386 QTC Calculation: 405 R Axis:   -12 Text  Interpretation: Sinus rhythm Low voltage, precordial leads RSR' in V1 or V2, right VCD or RVH No significant change since last tracing Confirmed by Gareth Morgan [phone]) on 08/25/2021 6:42:15 PM  Radiology No results found.  Procedures Procedures    Medications Ordered in ED Medications  meclizine (ANTIVERT) tablet 50 mg (50 mg Oral Given 08/25/21 1919)  lactated ringers bolus 1,000 mL (0 mLs Intravenous Stopped 08/25/21 2056)  diazepam (VALIUM) injection 2.5 mg (2.5 mg Intravenous Given 08/25/21 2206)    ED Course/ Medical Decision Making/ A&P                           Medical Decision Making Amount and/or Complexity of Data Reviewed Labs: ordered.  Risk Prescription  drug management.     51yo male with history of prior episode of vertigo in May 2020 who presents with concern for vertigo, left ear pain, nausea, vomiting, also reports chest pain.   Differential diagnosis for dizziness includes central causes such as stroke, intracranial bleed, mass and peripheral causes such as BPPV, meniere's disease, viral.  Labs personally evaluated by me show no anemia, significant electrolyte abnormalities, UTI, ASC.  Vertigo is positional, patient has normal neurologic exam, including normal gait and coordination, and no risk factors of stroke and have low suspicion for CVA or other central cause of vertigo. Has prior hx of vertigo. He feels improved after meclizine.  Has signs of left otitis media, ear pain, ringinging suggesting inner ear etiology. Given rx for meclizine and augmentin.  Did report chest tightness-EKG with benign early repolarization, troponin negative 6 hours after symptom start and have low suspicion for ACS.  Patient discharged in stable condition with understanding of reasons to return.         Final Clinical Impression(s) / ED Diagnoses Final diagnoses:  Vertigo  Chest pain, unspecified type  Acute suppurative otitis media of left ear without spontaneous rupture of tympanic membrane, recurrence not specified    Rx / DC Orders ED Discharge Orders          Ordered    meclizine (ANTIVERT) 25 MG tablet  3 times daily PRN        08/25/21 2151    ondansetron (ZOFRAN-ODT) 4 MG disintegrating tablet  Every 8 hours PRN        08/25/21 2151    amoxicillin-clavulanate (AUGMENTIN) 875-125 MG tablet  Every 12 hours        08/25/21 2151              Gareth Morgan, MD 08/26/21 1007

## 2022-04-27 ENCOUNTER — Ambulatory Visit: Payer: BC Managed Care – PPO

## 2022-04-27 DIAGNOSIS — Z23 Encounter for immunization: Secondary | ICD-10-CM

## 2023-04-12 ENCOUNTER — Ambulatory Visit: Payer: BC Managed Care – PPO

## 2023-04-12 DIAGNOSIS — Z23 Encounter for immunization: Secondary | ICD-10-CM
